# Patient Record
Sex: Female | Born: 1965 | Race: White | Hispanic: No | Marital: Married | Smoking: Current every day smoker
Health system: Southern US, Community
[De-identification: ages and names within clinical notes are randomized; demographics above are authoritative.]

## PROBLEM LIST (undated history)

## (undated) DIAGNOSIS — Z86010 Personal history of colonic polyps: Secondary | ICD-10-CM

## (undated) DIAGNOSIS — Z973 Presence of spectacles and contact lenses: Secondary | ICD-10-CM

## (undated) HISTORY — PX: MENISCUS REPAIR: SHX5179

## (undated) HISTORY — DX: Personal history of colonic polyps: Z86.010

## (undated) HISTORY — PX: COLONOSCOPY: SHX174

## (undated) HISTORY — PX: LEEP: SHX91

## (undated) HISTORY — PX: DILATION AND CURETTAGE OF UTERUS: SHX78

## (undated) HISTORY — PX: POLYPECTOMY: SHX149

---

## 2000-01-02 HISTORY — PX: ROTATOR CUFF REPAIR: SHX139

## 2004-05-31 ENCOUNTER — Ambulatory Visit (HOSPITAL_COMMUNITY): Admission: RE | Admit: 2004-05-31 | Discharge: 2004-05-31 | Payer: Self-pay | Admitting: Orthopedic Surgery

## 2004-05-31 ENCOUNTER — Ambulatory Visit (HOSPITAL_BASED_OUTPATIENT_CLINIC_OR_DEPARTMENT_OTHER): Admission: RE | Admit: 2004-05-31 | Discharge: 2004-05-31 | Payer: Self-pay | Admitting: Orthopedic Surgery

## 2009-01-01 HISTORY — PX: ANKLE SURGERY: SHX546

## 2012-01-02 DIAGNOSIS — Z86010 Personal history of colonic polyps: Secondary | ICD-10-CM

## 2012-01-02 HISTORY — DX: Personal history of colonic polyps: Z86.010

## 2012-08-05 ENCOUNTER — Encounter: Payer: Self-pay | Admitting: Gastroenterology

## 2012-08-29 ENCOUNTER — Ambulatory Visit (INDEPENDENT_AMBULATORY_CARE_PROVIDER_SITE_OTHER): Payer: 59 | Admitting: Gastroenterology

## 2012-08-29 ENCOUNTER — Encounter: Payer: Self-pay | Admitting: Gastroenterology

## 2012-08-29 ENCOUNTER — Other Ambulatory Visit (INDEPENDENT_AMBULATORY_CARE_PROVIDER_SITE_OTHER): Payer: 59

## 2012-08-29 ENCOUNTER — Other Ambulatory Visit: Payer: 59

## 2012-08-29 VITALS — BP 110/80 | HR 72 | Ht 61.75 in | Wt 206.2 lb

## 2012-08-29 DIAGNOSIS — R1032 Left lower quadrant pain: Secondary | ICD-10-CM

## 2012-08-29 DIAGNOSIS — R197 Diarrhea, unspecified: Secondary | ICD-10-CM

## 2012-08-29 MED ORDER — NA SULFATE-K SULFATE-MG SULF 17.5-3.13-1.6 GM/177ML PO SOLN: ORAL | Status: DC

## 2012-08-29 NOTE — Patient Instructions (Signed)
  You have been scheduled for a colonoscopy with propofol. Please follow written instructions given to you at your visit today.  Please pick up your prep kit at the pharmacy within the next 1-3 days. If you use inhalers (even only as needed), please bring them with you on the day of your procedure. Your physician has requested that you go to www.[email or web address] and enter the access code given to you at your visit today. This web site gives a general overview about your procedure. However, you should still follow specific instructions given to you by our office regarding your preparation for the procedure.  Please purchase Align over the counter and take one capsule by mouth once daily. Align puts good bacteria in your colon.  FOD MAP Diet given for your review.  Your physician has requested that you go to the basement for lab work before leaving today. ___________________________________________________________________________________________________                                               We are excited to introduce MyChart, a new best-in-class service that provides you online access to important information in your electronic medical record. We want to make it easier for you to view your health information - all in one secure location - when and where you need it. We expect MyChart will enhance the quality of care and service we provide.  When you register for MyChart, you can:    View your test results.    Request appointments and receive appointment reminders via email.    Request medication renewals.    View your medical history, allergies, medications and immunizations.    Communicate with your physician's office through a password-protected site.    Conveniently print information such as your medication lists.  To find out if MyChart is right for you, please talk to a member of our clinical staff today. We will gladly answer your questions about this free health and wellness  tool.  If you are age 88 or older and want a member of your family to have access to your record, you must provide written consent by completing a proxy form available at our office. Please speak to our clinical staff about guidelines regarding accounts for patients younger than age 45.  As you activate your MyChart account and need any technical assistance, please call the MyChart technical support line at [phone]) or email your question to [email or web address]. If you email your question(s), please include your name, a return phone number and the best time to reach you.  If you have non-urgent health-related questions, you can send a message to our office through MyChart at [email or web address]. If you have a medical emergency, call 911.  Thank you for using MyChart as your new health and wellness resource!   MyChart licensed from [employer],  [id number]. Patents Pending.

## 2012-08-29 NOTE — Progress Notes (Signed)
History of Present Illness:  This is a very pleasant 47 year old Caucasian female who has had 6 weeks of diarrhea described as loose nonbloody stools with some left lower quadrant cramping, episodes of incontinence, and some nocturnal diarrhea.  She says her diarrhea persists even if she does not eat, but she also has had partially digested food products in her stools.  Apparently stool O&P and routine culture and sensitivity is been negative.  She was treated empirically with Cipro 500 mg twice a day for 5 days several weeks ago without improvement.  She has tried lactobacillus supplements and Imodium with little response.  She denies anorexia, weight loss, or any systemic, upper GI or hepatobiliary complaints.  Apparently stool is now patient is been Hemoccult negative.  She's not had any previous gastrointestinal problems except for occasional one to two-day diarrhea episodes which abated normally.  There is been no infectious disease exposure, foreign travel, sick family members at home, family history of gastrointestinal problems.  She vehemently denies exposure antibiotics in the last year.  I have reviewed this patient's present history, medical and surgical past history, allergies and medications.     ROS:   All systems were reviewed and are negative unless otherwise stated in the HPI.  Allergies  Allergen Reactions  . Codeine Hives  . Sulfur Hives   No outpatient prescriptions prior to visit.   No facility-administered medications prior to visit.   History reviewed. No pertinent past medical history. Past Surgical History  Procedure Laterality Date  . Rotator cuff repair    . Ankle surgery    . Leep     History   Social History  . Marital Status: Married    Spouse Name: N/A    Number of Children: 2  . Years of Education: N/A   Occupational History  . Production Tech    Social History Main Topics  . Smoking status: Current Every Day Smoker  . Smokeless tobacco: Never Used   . Alcohol Use: No  . Drug Use: No  . Sexual Activity: None   Other Topics Concern  . None   Social History Narrative  . None   Family History  Problem Relation Age of Onset  . Heart disease Father        Physical Exam: Blood pressure 110/80, pulse 72 and regular and weight 206 with a BMI of 38.04. General well developed well nourished patient in no acute distress, appearing their stated age Eyes PERRLA, no icterus, fundoscopic exam per opthamologist Skin no lesions noted Neck supple, no adenopathy, no thyroid enlargement, no tenderness Chest clear to percussion and auscultation Heart no significant murmurs, gallops or rubs noted Abdomen no hepatosplenomegaly masses or tenderness, BS normal.  Rectal inspection normal no fissures, or fistulae noted.  No masses or tenderness on digital exam. Stool guaiac negative.  Stool is soft and fairly formed, has no particular odor, and is guaiac negative. Extremities no acute joint lesions, edema, phlebitis or evidence of cellulitis. Neurologic patient oriented x 3, cranial nerves intact, no focal neurologic deficits noted. Psychological mental status normal and normal affect.  Assessment and plan: Rule out inflammatory bowel disease versus IBS diarrhea resulting from an initial viral gastroenteritis.  I will repeat her stool O&P exam, C. difficile toxin assay,[place] fat staining, Giardia antigen, also do celiac panel, serum carotene, sedimentation rate and CRP.  She's been scheduled for colonoscopy at her convenience.  In the interim, she is to use a FOD-MAP diet for IBS diarrhea with when  necessary Imodium, and I placed her on Align daily probiotic therapy.  She may be a good candidate for Lotronex therapy if her workup is otherwise negative.  There is no history of cigarette, alcohol, or NSAID abuse.  She also has normal menstrual periods, and denies gynecologic problems.  She's not had previous endoscopic or colonoscopy exams.  She denies any  stressful situations in her life with history of neuropsychiatric problems.  The patient tried various lamination diets without any success.  There is no past history of hepatitis, pancreatitis, skin rashes, joint pains, or the reasons to suspect malabsorption.

## 2012-09-02 LAB — CELIAC PANEL 10
Endomysial Screen: NEGATIVE
Gliadin IgA: 1.8 U/mL (ref <20)
Gliadin IgG: 3.2 U/mL (ref <20)
IgA: 63 mg/dL — ABNORMAL LOW (ref 69–380)
Tissue Transglutaminase Ab, IgA: 9.1 U/mL (ref <20)

## 2012-09-12 ENCOUNTER — Ambulatory Visit (AMBULATORY_SURGERY_CENTER): Payer: 59 | Admitting: Gastroenterology

## 2012-09-12 ENCOUNTER — Encounter: Payer: Self-pay | Admitting: Gastroenterology

## 2012-09-12 VITALS — BP 140/77 | HR 65 | Temp 96.9°F | Resp 24 | Ht 61.0 in | Wt 206.0 lb

## 2012-09-12 DIAGNOSIS — Z1211 Encounter for screening for malignant neoplasm of colon: Secondary | ICD-10-CM

## 2012-09-12 DIAGNOSIS — D126 Benign neoplasm of colon, unspecified: Secondary | ICD-10-CM

## 2012-09-12 DIAGNOSIS — R197 Diarrhea, unspecified: Secondary | ICD-10-CM

## 2012-09-12 MED ORDER — SODIUM CHLORIDE 0.9 % IV SOLN: 500.0000 mL | INTRAVENOUS | Status: DC

## 2012-09-12 NOTE — Patient Instructions (Addendum)

## 2012-09-12 NOTE — Progress Notes (Signed)
Called to room to assist during endoscopic procedure.  Patient ID and intended procedure confirmed with present staff. Received instructions for my participation in the procedure from the performing physician.  

## 2012-09-12 NOTE — Progress Notes (Signed)
Patient did not have preoperative order for IV antibiotic SSI prophylaxis. (G8918)  Patient did not experience any of the following events: a burn prior to discharge; a fall within the facility; wrong site/side/patient/procedure/implant event; or a hospital transfer or hospital admission upon discharge from the facility. (G8907)  

## 2012-09-12 NOTE — Op Note (Signed)
[facility] [address] [place], [address]   COLONOSCOPY PROCEDURE REPORT  PATIENT: Angela Wood, Angela Wood  MR#: [id number] BIRTHDATE: 10/09/1965 , 47  yrs. old GENDER: Female ENDOSCOPIST: Mardella Layman, MD, [facility] REFERRED BY: PROCEDURE DATE:  09/12/2012 PROCEDURE:   Colonoscopy with biopsy and snare polypectomy First Screening Colonoscopy - Avg.  risk and is 50 yrs.  old or older Yes.  Prior Negative Screening - Now for repeat screening. N/A      Polyps Removed Today? Yes. ASA CLASS:   Class II INDICATIONS:average risk screening, chronic diarrhea, and unexplained diarrhea. MEDICATIONS: propofol (Diprivan) 200mg  IV  DESCRIPTION OF PROCEDURE:   After the risks benefits and alternatives of the procedure were thoroughly explained, informed consent was obtained.  A digital rectal exam revealed no abnormalities of the rectum.   The LB [id number]  endoscope was introduced through the anus and advanced to the cecum, which was identified by both the appendix and ileocecal valve. No adverse events experienced.   The quality of the prep was excellent, using MoviPrep  The instrument was then slowly withdrawn as the colon was fully examined.      COLON FINDINGS: A normal appearing cecum, ileocecal valve, and appendiceal orifice were identified.  The ascending, hepatic flexure, transverse, splenic flexure, descending, sigmoid colon and rectum appeared unremarkable.  No polyps or cancers were seen.Biopsies every 10 cm. done.   A polypoid shaped sessile polyp ranging between 3-25mm in size was found in the sigmoid colon.  A polypectomy was performed with a cold snare.  The resection was complete and the polyp tissue was completely retrieved. Retroflexed views revealed no abnormalities. The time to cecum=2 minutes 20 seconds.  Withdrawal time=7 minutes 14 seconds.  The scope was withdrawn and the procedure completed. COMPLICATIONS: There were no  complications.  ENDOSCOPIC IMPRESSION: 1.   Normal colon...r/o microscopic/collagenous colitis 2.   Sessile polyp ranging between 3-6mm in size was found in the sigmoid colon; polypectomy was performed with a cold snare  RECOMMENDATIONS: 1.  Await biopsy results 2.  Repeat colonoscopy in 5 years if polyp adenomatous; otherwise 10 years 3. Start Lotronex .5 md/day if biopsies are normal.....   eSigned:  Mardella Layman, MD, [facility] 09/12/2012 1:42 PM   cc:

## 2012-09-12 NOTE — Progress Notes (Signed)
1342 A/ox3 pleased with MAC, report to [employer]

## 2012-09-15 ENCOUNTER — Telehealth: Payer: Self-pay

## 2012-09-15 NOTE — Telephone Encounter (Signed)
Left a message on the pt's answering machine at # [phone] for the pt to call if any questions or concerns. Maw

## 2012-10-01 ENCOUNTER — Encounter: Payer: Self-pay | Admitting: Gastroenterology

## 2012-11-06 ENCOUNTER — Other Ambulatory Visit: Payer: Self-pay

## 2013-09-05 ENCOUNTER — Emergency Department (HOSPITAL_COMMUNITY): Payer: 59

## 2013-09-05 ENCOUNTER — Encounter (HOSPITAL_COMMUNITY): Payer: Self-pay | Admitting: Emergency Medicine

## 2013-09-05 ENCOUNTER — Emergency Department (HOSPITAL_COMMUNITY)
Admission: EM | Admit: 2013-09-05 | Discharge: 2013-09-05 | Disposition: A | Discharge status: Home / Self Care | Payer: 59 | Attending: Emergency Medicine | Admitting: Emergency Medicine

## 2013-09-05 DIAGNOSIS — S82843A Displaced bimalleolar fracture of unspecified lower leg, initial encounter for closed fracture: Secondary | ICD-10-CM | POA: Insufficient documentation

## 2013-09-05 DIAGNOSIS — F17200 Nicotine dependence, unspecified, uncomplicated: Secondary | ICD-10-CM | POA: Insufficient documentation

## 2013-09-05 DIAGNOSIS — Y9289 Other specified places as the place of occurrence of the external cause: Secondary | ICD-10-CM | POA: Diagnosis not present

## 2013-09-05 DIAGNOSIS — Y9389 Activity, other specified: Secondary | ICD-10-CM | POA: Diagnosis not present

## 2013-09-05 DIAGNOSIS — Z79899 Other long term (current) drug therapy: Secondary | ICD-10-CM | POA: Diagnosis not present

## 2013-09-05 DIAGNOSIS — S82402A Unspecified fracture of shaft of left fibula, initial encounter for closed fracture: Secondary | ICD-10-CM

## 2013-09-05 DIAGNOSIS — S8261XA Displaced fracture of lateral malleolus of right fibula, initial encounter for closed fracture: Secondary | ICD-10-CM

## 2013-09-05 DIAGNOSIS — S99929A Unspecified injury of unspecified foot, initial encounter: Secondary | ICD-10-CM

## 2013-09-05 DIAGNOSIS — S8990XA Unspecified injury of unspecified lower leg, initial encounter: Secondary | ICD-10-CM | POA: Insufficient documentation

## 2013-09-05 DIAGNOSIS — S99919A Unspecified injury of unspecified ankle, initial encounter: Secondary | ICD-10-CM

## 2013-09-05 DIAGNOSIS — Z9889 Other specified postprocedural states: Secondary | ICD-10-CM | POA: Insufficient documentation

## 2013-09-05 DIAGNOSIS — IMO0002 Reserved for concepts with insufficient information to code with codable children: Secondary | ICD-10-CM | POA: Diagnosis not present

## 2013-09-05 DIAGNOSIS — W108XXA Fall (on) (from) other stairs and steps, initial encounter: Secondary | ICD-10-CM | POA: Diagnosis not present

## 2013-09-05 MED ORDER — NAPROXEN 500 MG PO TABS: 500.0000 mg | ORAL_TABLET | Freq: Two times a day (BID) | ORAL | Status: DC | PRN

## 2013-09-05 MED ORDER — ACETAMINOPHEN 325 MG PO TABS
650.0000 mg | ORAL_TABLET | Freq: Once | ORAL | Status: AC
  Administered 2013-09-05: 650 mg via ORAL
  Filled 2013-09-05: qty 2

## 2013-09-05 MED ORDER — ACETAMINOPHEN-CODEINE #3 300-30 MG PO TABS: 1.0000 | ORAL_TABLET | Freq: Four times a day (QID) | ORAL | Status: DC | PRN

## 2013-09-05 MED ORDER — HYDROCODONE-ACETAMINOPHEN 5-325 MG PO TABS: 1.0000 | ORAL_TABLET | Freq: Four times a day (QID) | ORAL | Status: DC | PRN

## 2013-09-05 NOTE — Discharge Instructions (Signed)
Wear ankle splint until you see Dr. Percell Miller. Use crutches for all weight bearing, do not use your LEFT ankle for weight bearing, but you can use your right ankle. Use a strap up brace on the right ankle for support. Ice and elevate ankle throughout the day. Alternate between naprosyn and norco for pain relief. Do not drive or operate machinery with pain medication use. Call Dr. Percell Miller on Tuesday for ongoing management of your ankle fracture. Return to the ER for changes or worsening symptoms.   Ankle Fracture A fracture is a break in a bone. A cast or splint may be used to protect the ankle and heal the break. Sometimes, surgery is needed. HOME CARE  Use crutches as told by your doctor. It is very important that you use your crutches correctly.  Do not put weight or pressure on the injured ankle until told by your doctor.  Keep your ankle raised (elevated) when sitting or lying down.  Apply ice to the ankle:  Put ice in a plastic bag.  Place a towel between your cast and the bag.  Leave the ice on for 20 minutes, 2-3 times a day.  If you have a plaster or fiberglass cast:  Do not try to scratch under the cast with any objects.  Check the skin around the cast every day. You may put lotion on red or sore areas.  Keep your cast dry and clean.  If you have a plaster splint:  Wear the splint as told by your doctor.  You can loosen the elastic around the splint if your toes get numb, tingle, or turn cold or blue.  Do not put pressure on any part of your cast or splint. It may break. Rest your plaster splint or cast only on a pillow the first 24 hours until it is fully hardened.  Cover your cast or splint with a plastic bag during showers.  Do not lower your cast or splint into water.  Take medicine as told by your doctor.  Do not drive until your doctor says it is safe.  Follow-up with your doctor as told. It is very important that you go to your follow-up visits. GET HELP  IF: The swelling and discomfort gets worse.  GET HELP RIGHT AWAY IF:   Your splint or cast breaks.  You continue to have very bad pain.  You have new pain or swelling after your splint or cast was put on.  Your skin or toes below the injured ankle:  Turn blue or gray.  Feel cold, numb, or you cannot feel them.  There is a bad smell or yellowish white fluid (pus) coming from under the splint or cast. MAKE SURE YOU:   Understand these instructions.  Will watch your condition.  Will get help right away if you are not doing well or get worse. Document Released: 10/15/2008 Document Revised: 10/08/2012 Document Reviewed: 07/17/2012 [facility] Patient Information 2015 [place], [place]. This information is not intended to replace advice given to you by your health care provider. Make sure you discuss any questions you have with your health care provider.  Cast or Splint Care Casts and splints support injured limbs and keep bones from moving while they heal. It is important to care for your cast or splint at home.  HOME CARE INSTRUCTIONS  Keep the cast or splint uncovered during the drying period. It can take 24 to 48 hours to dry if it is made of plaster. A fiberglass cast will  dry in less than 1 hour.  Do not rest the cast on anything harder than a pillow for the first 24 hours.  Do not put weight on your injured limb or apply pressure to the cast until your health care provider gives you permission.  Keep the cast or splint dry. Wet casts or splints can lose their shape and may not support the limb as well. A wet cast that has lost its shape can also create harmful pressure on your skin when it dries. Also, wet skin can become infected.  Cover the cast or splint with a plastic bag when bathing or when out in the rain or snow. If the cast is on the trunk of the body, take sponge baths until the cast is removed.  If your cast does become wet, dry it with a towel or a blow dryer on the  cool setting only.  Keep your cast or splint clean. Soiled casts may be wiped with a moistened cloth.  Do not place any hard or soft foreign objects under your cast or splint, such as cotton, toilet paper, lotion, or powder.  Do not try to scratch the skin under the cast with any object. The object could get stuck inside the cast. Also, scratching could lead to an infection. If itching is a problem, use a blow dryer on a cool setting to relieve discomfort.  Do not trim or cut your cast or remove padding from inside of it.  Exercise all joints next to the injury that are not immobilized by the cast or splint. For example, if you have a long leg cast, exercise the hip joint and toes. If you have an arm cast or splint, exercise the shoulder, elbow, thumb, and fingers.  Elevate your injured arm or leg on 1 or 2 pillows for the first 1 to 3 days to decrease swelling and pain.It is best if you can comfortably elevate your cast so it is higher than your heart. SEEK MEDICAL CARE IF:   Your cast or splint cracks.  Your cast or splint is too tight or too loose.  You have unbearable itching inside the cast.  Your cast becomes wet or develops a soft spot or area.  You have a bad smell coming from inside your cast.  You get an object stuck under your cast.  Your skin around the cast becomes red or raw.  You have new pain or worsening pain after the cast has been applied. SEEK IMMEDIATE MEDICAL CARE IF:   You have fluid leaking through the cast.  You are unable to move your fingers or toes.  You have discolored (blue or white), cool, painful, or very swollen fingers or toes beyond the cast.  You have tingling or numbness around the injured area.  You have severe pain or pressure under the cast.  You have any difficulty with your breathing or have shortness of breath.  You have chest pain. Document Released: 12/16/1999 Document Revised: 10/08/2012 Document Reviewed:  06/26/2012 [facility] Patient Information 2015 [place], [place]. This information is not intended to replace advice given to you by your health care provider. Make sure you discuss any questions you have with your health care provider.

## 2013-09-05 NOTE — ED Notes (Signed)
The pt fell down steps after she missed 2 steps.  She has pain in her lt ankle.  lmp 2 days ago

## 2013-09-05 NOTE — Progress Notes (Addendum)
Orthopedic Tech Progress Note Patient Details:  Angela Wood August 18, 1965 [id number] Applied fiberglass posterior short leg and fiberglass stirrup splint to LLE.  Pulses, sensation, movement intact before and after splinting.  Capillary refill less than 2 seconds before and after splinting.  Fit crutches and taught pt. use of same. Ortho Devices Type of Ortho Device: Post (short leg) splint Ortho Device/Splint Location: LLE Ortho Device/Splint Interventions: Application   Darrol Poke 09/05/2013, 9:34 AM

## 2013-09-05 NOTE — ED Notes (Signed)
Pt refusing to have pain medication. Nurse walked in room and pt with help of husband walked to bathroom in the room. Pt instructed not to walk anymore d/t injury to ankle.

## 2013-09-05 NOTE — ED Provider Notes (Signed)
CSN: [id number]     Arrival date & time 09/05/13  9811 History   First MD Initiated Contact with Patient 09/05/13 [phone]     Chief Complaint  Patient presents with  . Ankle Injury     (Consider location/radiation/quality/duration/timing/severity/associated sxs/prior Treatment) HPI Comments: Angela Wood is a 48 y.o. female with a PSHx of tendon repair to R ankle, who presents to the ED with complaints of bilateral ankle pain after falling down the stairs, states that she missed 2 steps which resulted in the fall. Reports her left ankle is tender over the lateral aspect, 7/10 sharp pain, constant, nonradiating, worse with ambulation, and with no known alleviating factors given that she did not take anything prior to arrival. She also reports some right-sided ankle pain over the area of her previous tendon repair on the medial aspect, but that this is a dull minor pain and she is able to ambulate on this foot. Denies any head injury or loss of consciousness. Received some scrapes to her knees and hands, but no injuries to these areas. Denies any numbness or tingling, loss of range of motion, muscle weakness, back pain, neck pain, stiffness, or swelling. Denies any other injuries.  Patient is a 48 y.o. female presenting with ankle pain. The history is provided by the patient. No language interpreter was used.  Ankle Pain Location:  Ankle Time since incident:  1 hour Injury: yes   Mechanism of injury: fall   Fall:    Fall occurred:  Down stairs   Height of fall:  2 steps   Impact surface:  [employer] of impact:  Feet and outstretched arms   Entrapped after fall: no   Ankle location:  L ankle and R ankle Pain details:    Quality:  Sharp   Radiates to:  Does not radiate   Severity:  Moderate (7/10)   Onset quality:  Sudden   Duration:  1 hour   Timing:  Constant   Progression:  Unchanged Chronicity:  New Prior injury to area: Right ankle- injured tendons previously, had  surgery. Relieved by:  None tried Worsened by:  Bearing weight Ineffective treatments:  None tried Associated symptoms: no back pain, no decreased ROM, no muscle weakness, no neck pain, no numbness, no stiffness, no swelling and no tingling     History reviewed. No pertinent past medical history. Past Surgical History  Procedure Laterality Date  . Rotator cuff repair    . Ankle surgery    . Leep    . Ankle surgery     Family History  Problem Relation Age of Onset  . Heart disease Father    History  Substance Use Topics  . Smoking status: Current Every Day Smoker  . Smokeless tobacco: Never Used  . Alcohol Use: No   OB History   Grav Para Term Preterm Abortions TAB SAB Ect Mult Living                 Review of Systems  Musculoskeletal: Positive for arthralgias (ankle pain bilaterally) and gait problem (difficult to bear weight in L ankle). Negative for back pain, joint swelling, neck pain and stiffness.  Skin: Positive for wound (abrasions to knee). Negative for color change.  Neurological: Negative for syncope, weakness, numbness and headaches.  Hematological: Does not bruise/bleed easily.  Psychiatric/Behavioral: Negative for confusion.  10 Systems reviewed and are negative for acute change except as noted in the HPI.     Allergies  Codeine and  Sulfur  Home Medications   Prior to Admission medications   Medication Sig Start Date End Date Taking? Authorizing Provider  Melatonin 5 MG TABS Take 5 mg by mouth at bedtime.   Yes Historical Provider, MD  HYDROcodone-acetaminophen (NORCO) 5-325 MG per tablet Take 1-2 tablets by mouth every 6 (six) hours as needed for severe pain. 09/05/13   Afifa Truax Strupp Camprubi-Soms, PA-C  naproxen (NAPROSYN) 500 MG tablet Take 1 tablet (500 mg total) by mouth 2 (two) times daily as needed for mild pain, moderate pain or headache (TAKE WITH MEALS.). 09/05/13   Daphney Hopke Strupp Camprubi-Soms, PA-C   BP 140/74  Pulse 81  Temp(Src) 98.6 F  (37 C) (Oral)  Resp 18  Ht 5\' 6"  (1.676 m)  Wt 208 lb (94.348 kg)  BMI 33.59 kg/m2  SpO2 98%  LMP 09/02/2013 Physical Exam  Nursing note and vitals reviewed. Constitutional: She is oriented to person, place, and time. Vital signs are normal. She appears well-developed and well-nourished. No distress.  VSS, NAD, pleasant and cooperative  HENT:  Head: Normocephalic and atraumatic.  Mouth/Throat: Mucous membranes are normal.  Eyes: Conjunctivae and EOM are normal. Right eye exhibits no discharge. Left eye exhibits no discharge.  Neck: Normal range of motion. Neck supple. No spinous process tenderness and no muscular tenderness present. No rigidity. Normal range of motion present.  FROM intact without spinous process or paraspinous muscle TTP, no bony stepoffs or deformities, no muscle spasms. No rigidity or meningeal signs. No bruising or swelling.  Cardiovascular: Normal rate and intact distal pulses.   Distal pulses intact  Pulmonary/Chest: Effort normal. No respiratory distress.  Abdominal: Normal appearance. She exhibits no distension.  Musculoskeletal: Normal range of motion.       Right ankle: She exhibits swelling. She exhibits normal range of motion, no ecchymosis, no deformity and normal pulse. Tenderness. Lateral malleolus tenderness found. Achilles tendon normal.       Left ankle: She exhibits swelling and ecchymosis. She exhibits normal range of motion, no deformity and normal pulse. Tenderness. Lateral malleolus tenderness found. Achilles tendon normal.  L ankle with FROM intact, mild swelling and bruising noted to lateral malleolus, approx 3cm proximal from the end, with TTP over this area. No other TTP in surrounding soft tissues. Achilles tendon WNL. R ankle with FROM intact, small amount of trace swelling to distal lateral malleolus with no bruising, mild TTP to this area, no other TTP in remainder of foot/ankle. Achilles tendon WNL.  Strength 5/5 in all extremities, antalgic  gait but able to ambulate with both ankles, sensation grossly intact, distal pulses and cap refill intact throughout.  Neurological: She is alert and oriented to person, place, and time. She has normal strength. No sensory deficit. Gait (antalgic) abnormal.  Skin: Skin is warm, dry and intact. Bruising (L ankle) noted. No rash noted.  Psychiatric: She has a normal mood and affect.    ED Course  Procedures (including critical care time) Labs Review Labs Reviewed - No data to display  Imaging Review Dg Ankle Complete Left  09/05/2013   CLINICAL DATA:  Ankle pain and swelling.  Fall.  EXAM: LEFT ANKLE COMPLETE - 3+ VIEW  COMPARISON:  None.  FINDINGS: There is an oblique fracture in the distal fibula extending into the ankle joint. The fracture begins at the tibial plafond and then extends superiorly and posteriorly. Medial malleolus and posterior malleolus are intact. Talus and calcaneus are intact. Spur at the inferior calcaneus.  IMPRESSION: Lateral malleolus fracture.  Weber B injury.   Electronically Signed   By: Maryclare Bean M.D.   On: 09/05/2013 07:24   Dg Ankle Complete Right  09/05/2013   CLINICAL DATA:  Right ankle pain.  Fall.  EXAM: RIGHT ANKLE - COMPLETE 3+ VIEW  COMPARISON:  None.  FINDINGS: There is a small bony density dorsal to the talar neck which has a chronic appearance. Spurring at the inferior calcaneus. Small bony densities adjacent to the tip of the fibula are present. Avulsion fractures of indeterminate age are not excluded. There is associated soft tissue swelling about the lateral malleolus. Tibia is intact. Spurring at the inferior calcaneus.  IMPRESSION: Small avulsion fracture from the inferior tip of the fibula is not excluded.   Electronically Signed   By: Maryclare Bean M.D.   On: 09/05/2013 07:25     EKG Interpretation None      MDM   Final diagnoses:  Left fibular fracture, closed, initial encounter  Avulsion fracture of lateral malleolus, right, closed, initial  encounter    48y/o female with fall down 3 steps, and subsequent ankle pain, L>R. Pt declined pain meds after multiple offers, states she doesn't like taking meds. Xrays show L fibular fx, and R lateral malleolar avulsion fx. Discussed with Dr. French Ana (Dr. Debroah Loop associate) who stated to place pt in splint for L fx with nonweight bearing, but allow pt to bear weight on R. Given that pt is able to ambulate with R leg, will place splint and have her f/up with Dr. Percell Miller on Tuesday. Rx for pain control given. Discussed ice/elevation. I explained the diagnosis and have given explicit precautions to return to the ER including for any other new or worsening symptoms. The patient understands and accepts the medical plan as it's been dictated and I have answered their questions. Discharge instructions concerning home care and prescriptions have been given. The patient is STABLE and is discharged to home in good condition.  BP 148/58  Pulse 74  Temp(Src) 98.6 F (37 C) (Oral)  Resp 18  Ht 5\' 6"  (1.676 m)  Wt 208 lb (94.348 kg)  BMI 33.59 kg/m2  SpO2 100%  LMP 08/25/2013  Meds ordered this encounter  Medications  . naproxen (NAPROSYN) 500 MG tablet    Sig: Take 1 tablet (500 mg total) by mouth 2 (two) times daily as needed for mild pain, moderate pain or headache (TAKE WITH MEALS.).    Dispense:  20 tablet    Refill:  0    Order Specific Question:  Supervising Provider    Answer:  Noemi Chapel D [[id number]]  . HYDROcodone-acetaminophen (NORCO) 5-325 MG per tablet    Sig: Take 1-2 tablets by mouth every 6 (six) hours as needed for severe pain.    Dispense:  10 tablet    Refill:  0    Order Specific Question:  Supervising Provider    Answer:  Noemi Chapel D [[id number]]  . DISCONTD: acetaminophen-codeine (TYLENOL #3) 300-30 MG per tablet    Sig: Take 1 tablet by mouth every 6 (six) hours as needed for moderate pain.    Dispense:  20 tablet    Refill:  0    Order Specific Question:  Supervising  Provider    Answer:  Noemi Chapel D [[id number]]       Shaquita Fort Strupp Camprubi-Soms, PA-C 09/05/13 0900

## 2013-09-06 NOTE — ED Provider Notes (Signed)
Medical screening examination/treatment/procedure(s) were performed by non-physician practitioner and as supervising physician I was immediately available for consultation/collaboration.   EKG Interpretation None        Julianne Rice, MD 09/06/13 0700

## 2013-09-09 ENCOUNTER — Encounter (HOSPITAL_BASED_OUTPATIENT_CLINIC_OR_DEPARTMENT_OTHER): Payer: Self-pay | Admitting: *Deleted

## 2013-09-09 NOTE — Progress Notes (Signed)
No labs needed

## 2013-09-10 ENCOUNTER — Encounter (HOSPITAL_BASED_OUTPATIENT_CLINIC_OR_DEPARTMENT_OTHER): Payer: 59 | Admitting: Anesthesiology

## 2013-09-10 ENCOUNTER — Encounter (HOSPITAL_BASED_OUTPATIENT_CLINIC_OR_DEPARTMENT_OTHER): Payer: Self-pay | Admitting: *Deleted

## 2013-09-10 ENCOUNTER — Ambulatory Visit (HOSPITAL_BASED_OUTPATIENT_CLINIC_OR_DEPARTMENT_OTHER): Payer: 59 | Admitting: Anesthesiology

## 2013-09-10 ENCOUNTER — Ambulatory Visit (HOSPITAL_BASED_OUTPATIENT_CLINIC_OR_DEPARTMENT_OTHER)
Admission: RE | Admit: 2013-09-10 | Discharge: 2013-09-10 | Disposition: A | Discharge status: Home / Self Care | Payer: 59 | Source: Ambulatory Visit | Attending: Orthopedic Surgery | Admitting: Orthopedic Surgery

## 2013-09-10 ENCOUNTER — Encounter (HOSPITAL_BASED_OUTPATIENT_CLINIC_OR_DEPARTMENT_OTHER)
Admission: RE | Disposition: A | Discharge status: Home / Self Care | Payer: Self-pay | Source: Ambulatory Visit | Attending: Orthopedic Surgery

## 2013-09-10 DIAGNOSIS — W19XXXA Unspecified fall, initial encounter: Secondary | ICD-10-CM | POA: Diagnosis not present

## 2013-09-10 DIAGNOSIS — F17200 Nicotine dependence, unspecified, uncomplicated: Secondary | ICD-10-CM | POA: Insufficient documentation

## 2013-09-10 DIAGNOSIS — S82899A Other fracture of unspecified lower leg, initial encounter for closed fracture: Secondary | ICD-10-CM | POA: Insufficient documentation

## 2013-09-10 HISTORY — PX: ORIF ANKLE FRACTURE: SHX5408

## 2013-09-10 HISTORY — DX: Presence of spectacles and contact lenses: Z97.3

## 2013-09-10 LAB — POCT HEMOGLOBIN-HEMACUE: HEMOGLOBIN: 14.7 g/dL (ref 12.0–15.0)

## 2013-09-10 SURGERY — OPEN REDUCTION INTERNAL FIXATION (ORIF) ANKLE FRACTURE
Anesthesia: Regional | Site: Ankle | Laterality: Left

## 2013-09-10 MED ORDER — OXYCODONE HCL 5 MG/5ML PO SOLN: 5.0000 mg | Freq: Once | ORAL | Status: DC | PRN

## 2013-09-10 MED ORDER — MIDAZOLAM HCL 2 MG/2ML IJ SOLN: 1.0000 mg | INTRAMUSCULAR | Status: DC | PRN

## 2013-09-10 MED ORDER — LIDOCAINE HCL (CARDIAC) 20 MG/ML IV SOLN
INTRAVENOUS | Status: DC | PRN
  Administered 2013-09-10: 50 mg via INTRAVENOUS

## 2013-09-10 MED ORDER — LACTATED RINGERS IV SOLN
INTRAVENOUS | Status: DC
  Administered 2013-09-10 (×2): via INTRAVENOUS

## 2013-09-10 MED ORDER — LACTATED RINGERS IV SOLN: INTRAVENOUS | Status: DC

## 2013-09-10 MED ORDER — OXYCODONE HCL 5 MG PO TABS: 5.0000 mg | ORAL_TABLET | Freq: Once | ORAL | Status: DC | PRN

## 2013-09-10 MED ORDER — DEXAMETHASONE SODIUM PHOSPHATE 10 MG/ML IJ SOLN
INTRAMUSCULAR | Status: DC | PRN
  Administered 2013-09-10: 10 mg via INTRAVENOUS

## 2013-09-10 MED ORDER — PROPOFOL 10 MG/ML IV BOLUS
INTRAVENOUS | Status: DC | PRN
  Administered 2013-09-10: 200 mg via INTRAVENOUS

## 2013-09-10 MED ORDER — FENTANYL CITRATE 0.05 MG/ML IJ SOLN
INTRAMUSCULAR | Status: DC | PRN
  Administered 2013-09-10: 25 ug via INTRAVENOUS

## 2013-09-10 MED ORDER — FENTANYL CITRATE 0.05 MG/ML IJ SOLN
INTRAMUSCULAR | Status: AC
  Filled 2013-09-10: qty 4

## 2013-09-10 MED ORDER — ASPIRIN 81 MG PO TABS: 81.0000 mg | ORAL_TABLET | Freq: Every day | ORAL | Status: DC

## 2013-09-10 MED ORDER — ONDANSETRON HCL 4 MG/2ML IJ SOLN
INTRAMUSCULAR | Status: DC | PRN
  Administered 2013-09-10: 4 mg via INTRAVENOUS

## 2013-09-10 MED ORDER — FENTANYL CITRATE 0.05 MG/ML IJ SOLN
50.0000 ug | INTRAMUSCULAR | Status: DC | PRN
Start: 2013-09-10 — End: 2013-09-10
  Administered 2013-09-10: 100 ug via INTRAVENOUS

## 2013-09-10 MED ORDER — DOCUSATE SODIUM 100 MG PO CAPS: 100.0000 mg | ORAL_CAPSULE | Freq: Two times a day (BID) | ORAL | Status: DC

## 2013-09-10 MED ORDER — HYDROMORPHONE HCL PF 1 MG/ML IJ SOLN: 0.2500 mg | INTRAMUSCULAR | Status: DC | PRN

## 2013-09-10 MED ORDER — ONDANSETRON HCL 4 MG PO TABS: 4.0000 mg | ORAL_TABLET | Freq: Three times a day (TID) | ORAL | Status: DC | PRN

## 2013-09-10 MED ORDER — BUPIVACAINE-EPINEPHRINE (PF) 0.5% -1:200000 IJ SOLN
INTRAMUSCULAR | Status: DC | PRN
Start: 2013-09-10 — End: 2013-09-10
  Administered 2013-09-10: 35 mL

## 2013-09-10 MED ORDER — ACETAMINOPHEN 500 MG PO TABS
1000.0000 mg | ORAL_TABLET | Freq: Once | ORAL | Status: AC
  Administered 2013-09-10: 1000 mg via ORAL

## 2013-09-10 MED ORDER — CEFAZOLIN SODIUM-DEXTROSE 2-3 GM-% IV SOLR
INTRAVENOUS | Status: AC
  Filled 2013-09-10: qty 50

## 2013-09-10 MED ORDER — DEXTROSE-NACL 5-0.45 % IV SOLN: 100.0000 mL/h | INTRAVENOUS | Status: DC

## 2013-09-10 MED ORDER — OXYCODONE-ACETAMINOPHEN 5-325 MG PO TABS
2.0000 | ORAL_TABLET | ORAL | Status: DC | PRN
Start: 2013-09-10 — End: 2017-10-07

## 2013-09-10 MED ORDER — ACETAMINOPHEN 500 MG PO TABS
ORAL_TABLET | ORAL | Status: AC
  Filled 2013-09-10: qty 2

## 2013-09-10 MED ORDER — MIDAZOLAM HCL 2 MG/2ML IJ SOLN
INTRAMUSCULAR | Status: AC
  Filled 2013-09-10: qty 2

## 2013-09-10 MED ORDER — CEFAZOLIN SODIUM-DEXTROSE 2-3 GM-% IV SOLR
2.0000 g | INTRAVENOUS | Status: AC
Start: 2013-09-10 — End: 2013-09-10
  Administered 2013-09-10: 2 g via INTRAVENOUS

## 2013-09-10 MED ORDER — FENTANYL CITRATE 0.05 MG/ML IJ SOLN: 50.0000 ug | INTRAMUSCULAR | Status: DC | PRN

## 2013-09-10 MED ORDER — FENTANYL CITRATE 0.05 MG/ML IJ SOLN
INTRAMUSCULAR | Status: AC
  Filled 2013-09-10: qty 2

## 2013-09-10 MED ORDER — MIDAZOLAM HCL 2 MG/2ML IJ SOLN
1.0000 mg | INTRAMUSCULAR | Status: DC | PRN
  Administered 2013-09-10: 2 mg via INTRAVENOUS

## 2013-09-10 SURGICAL SUPPLY — 67 items
BANDAGE ELASTIC 4 VELCRO ST LF (GAUZE/BANDAGES/DRESSINGS) ×2 IMPLANT
BANDAGE ELASTIC 6 VELCRO ST LF (GAUZE/BANDAGES/DRESSINGS) ×2 IMPLANT
BANDAGE ESMARK 6X9 LF (GAUZE/BANDAGES/DRESSINGS) ×1 IMPLANT
BIT DRILL 2.5X125 (BIT) ×2 IMPLANT
BIT DRILL 3.5X125 (BIT) ×1 IMPLANT
BLADE SURG 15 STRL LF DISP TIS (BLADE) ×2 IMPLANT
BLADE SURG 15 STRL SS (BLADE) ×2
BNDG COHESIVE 4X5 TAN STRL (GAUZE/BANDAGES/DRESSINGS) ×2 IMPLANT
BNDG ESMARK 6X9 LF (GAUZE/BANDAGES/DRESSINGS) ×2
CHLORAPREP W/TINT 26ML (MISCELLANEOUS) ×2 IMPLANT
COVER MAYO STAND STRL (DRAPES) ×2 IMPLANT
COVER TABLE BACK 60X90 (DRAPES) ×2 IMPLANT
CUFF TOURNIQUET SINGLE 24IN (TOURNIQUET CUFF) IMPLANT
CUFF TOURNIQUET SINGLE 34IN LL (TOURNIQUET CUFF) IMPLANT
CUFF TOURNIQUET SINGLE 44IN (TOURNIQUET CUFF) ×2 IMPLANT
DECANTER SPIKE VIAL GLASS SM (MISCELLANEOUS) IMPLANT
DRAPE C-ARM 42X72 X-RAY (DRAPES) IMPLANT
DRAPE C-ARMOR (DRAPES) IMPLANT
DRAPE EXTREMITY T 121X128X90 (DRAPE) ×2 IMPLANT
DRAPE OEC MINIVIEW 54X84 (DRAPES) ×2 IMPLANT
DRAPE U 20/CS (DRAPES) ×2 IMPLANT
DRAPE U-SHAPE 47X51 STRL (DRAPES) ×4 IMPLANT
DRILL BIT 3.5X125 (BIT) ×1
DRSG EMULSION OIL 3X3 NADH (GAUZE/BANDAGES/DRESSINGS) ×2 IMPLANT
ELECT REM PT RETURN 9FT ADLT (ELECTROSURGICAL) ×2
ELECTRODE REM PT RTRN 9FT ADLT (ELECTROSURGICAL) ×1 IMPLANT
GAUZE SPONGE 4X4 12PLY STRL (GAUZE/BANDAGES/DRESSINGS) ×2 IMPLANT
GLOVE BIO SURGEON STRL SZ7.5 (GLOVE) ×2 IMPLANT
GLOVE BIO SURGEON STRL SZ8 (GLOVE) ×4 IMPLANT
GLOVE BIOGEL PI IND STRL 7.0 (GLOVE) ×1 IMPLANT
GLOVE BIOGEL PI IND STRL 8 (GLOVE) ×3 IMPLANT
GLOVE BIOGEL PI INDICATOR 7.0 (GLOVE) ×1
GLOVE BIOGEL PI INDICATOR 8 (GLOVE) ×3
GLOVE ECLIPSE 6.5 STRL STRAW (GLOVE) ×2 IMPLANT
GOWN STRL REUS W/ TWL LRG LVL3 (GOWN DISPOSABLE) ×4 IMPLANT
GOWN STRL REUS W/ TWL XL LVL3 (GOWN DISPOSABLE) ×2 IMPLANT
GOWN STRL REUS W/TWL LRG LVL3 (GOWN DISPOSABLE) ×4
GOWN STRL REUS W/TWL XL LVL3 (GOWN DISPOSABLE) ×2
NEEDLE HYPO 22GX1.5 SAFETY (NEEDLE) IMPLANT
NS IRRIG 1000ML POUR BTL (IV SOLUTION) ×2 IMPLANT
PACK BASIN DAY SURGERY FS (CUSTOM PROCEDURE TRAY) ×2 IMPLANT
PAD CAST 4YDX4 CTTN HI CHSV (CAST SUPPLIES) ×1 IMPLANT
PADDING CAST COTTON 4X4 STRL (CAST SUPPLIES) ×1
PADDING CAST COTTON 6X4 STRL (CAST SUPPLIES) ×2 IMPLANT
PENCIL BUTTON HOLSTER BLD 10FT (ELECTRODE) ×2 IMPLANT
PLATE TUBULAR 1/3 5H (Plate) ×2 IMPLANT
SCREW CANCELLOUS 4.0X14 (Screw) ×2 IMPLANT
SCREW CORTEX ST MATTA 3.5X14 (Screw) ×4 IMPLANT
SCREW CORTEX ST MATTA 3.5X18MM (Screw) ×2 IMPLANT
SCREW CORTEX ST MATTA 3.5X20 (Screw) ×2 IMPLANT
SLEEVE SCD COMPRESS KNEE MED (MISCELLANEOUS) ×2 IMPLANT
SPLINT FAST PLASTER 5X30 (CAST SUPPLIES) ×15
SPLINT PLASTER CAST FAST 5X30 (CAST SUPPLIES) ×15 IMPLANT
SPONGE LAP 4X18 X RAY DECT (DISPOSABLE) ×2 IMPLANT
STRIP CLOSURE SKIN 1/2X4 (GAUZE/BANDAGES/DRESSINGS) ×2 IMPLANT
SUCTION FRAZIER TIP 10 FR DISP (SUCTIONS) ×2 IMPLANT
SUT MON AB 2-0 CT1 36 (SUTURE) ×2 IMPLANT
SUT MON AB 4-0 PC3 18 (SUTURE) ×2 IMPLANT
SUT VIC AB 0 SH 27 (SUTURE) ×2 IMPLANT
SUT VIC AB 2-0 SH 27 (SUTURE)
SUT VIC AB 2-0 SH 27XBRD (SUTURE) IMPLANT
SYR 20CC LL (SYRINGE) IMPLANT
SYR BULB 3OZ (MISCELLANEOUS) ×2 IMPLANT
TOWEL OR 17X24 6PK STRL BLUE (TOWEL DISPOSABLE) ×4 IMPLANT
TOWEL OR NON WOVEN STRL DISP B (DISPOSABLE) ×2 IMPLANT
TUBE CONNECTING 20X1/4 (TUBING) ×4 IMPLANT
UNDERPAD 30X30 INCONTINENT (UNDERPADS AND DIAPERS) ×2 IMPLANT

## 2013-09-10 NOTE — Discharge Instructions (Signed)
Elevate as much as possible.  No weight on Left leg  Keep splint clean and dry  Post Anesthesia Home Care Instructions  Activity: Get plenty of rest for the remainder of the day. A responsible adult should stay with you for 24 hours following the procedure.  For the next 24 hours, DO NOT: -Drive a car -Paediatric nurse -Drink alcoholic beverages -Take any medication unless instructed by your physician -Make any legal decisions or sign important papers.  Meals: Start with liquid foods such as gelatin or soup. Progress to regular foods as tolerated. Avoid greasy, spicy, heavy foods. If nausea and/or vomiting occur, drink only clear liquids until the nausea and/or vomiting subsides. Call your physician if vomiting continues.  Special Instructions/Symptoms: Your throat may feel dry or sore from the anesthesia or the breathing tube placed in your throat during surgery. If this causes discomfort, gargle with warm salt water. The discomfort should disappear within 24 hours.   Regional Anesthesia Blocks  1. Numbness or the inability to move the "blocked" extremity may last from 3-48 hours after placement. The length of time depends on the medication injected and your individual response to the medication. If the numbness is not going away after 48 hours, call your surgeon.  2. The extremity that is blocked will need to be protected until the numbness is gone and the  Strength has returned. Because you cannot feel it, you will need to take extra care to avoid injury. Because it may be weak, you may have difficulty moving it or using it. You may not know what position it is in without looking at it while the block is in effect.  3. For blocks in the legs and feet, returning to weight bearing and walking needs to be done carefully. You will need to wait until the numbness is entirely gone and the strength has returned. You should be able to move your leg and foot normally before you try and bear  weight or walk. You will need someone to be with you when you first try to ensure you do not fall and possibly risk injury.  4. Bruising and tenderness at the needle site are common side effects and will resolve in a few days.  5. Persistent numbness or new problems with movement should be communicated to the surgeon or the [facility] [place] [phone] [place] [phone]).

## 2013-09-10 NOTE — Progress Notes (Signed)
Assisted Dr. Edmond Fitzgerald with left, ultrasound guided, popliteal block. Side rails up, monitors on throughout procedure. See vital signs in flow sheet. Tolerated Procedure well. °

## 2013-09-10 NOTE — H&P (Signed)
      ORTHOPAEDIC CONSULTATION  REQUESTING PHYSICIAN: Renette Butters, MD  Chief Complaint: Left ankle fracture  HPI: Angela Wood is a 48 y.o. female who complains of  Mechanical fall  Past Medical History  Diagnosis Date  . Wears glasses    Past Surgical History  Procedure Laterality Date  . Rotator cuff repair  2002    lt  . Ankle surgery  2011    rt tendon repair  . Leep    . Dilation and curettage of uterus     History   Social History  . Marital Status: Married    Spouse Name: N/A    Number of Children: 2  . Years of Education: N/A   Occupational History  . Production Tech    Social History Main Topics  . Smoking status: Current Every Day Smoker -- 1.00 packs/day  . Smokeless tobacco: Never Used  . Alcohol Use: Yes     Comment: occ  . Drug Use: No  . Sexual Activity: None   Other Topics Concern  . None   Social History Narrative  . None   Family History  Problem Relation Age of Onset  . Heart disease Father    Allergies  Allergen Reactions  . Codeine Hives  . Sulfur Hives   Prior to Admission medications   Medication Sig Start Date End Date Taking? Authorizing Provider  HYDROcodone-acetaminophen (NORCO) 5-325 MG per tablet Take 1-2 tablets by mouth every 6 (six) hours as needed for severe pain. 09/05/13  Yes Mercedes Strupp Camprubi-Soms, PA-C  Melatonin 5 MG TABS Take 5 mg by mouth at bedtime.   Yes Historical Provider, MD  naproxen (NAPROSYN) 500 MG tablet Take 1 tablet (500 mg total) by mouth 2 (two) times daily as needed for mild pain, moderate pain or headache (TAKE WITH MEALS.). 09/05/13  Yes Mercedes Strupp Camprubi-Soms, PA-C   No results found.  Positive ROS: All other systems have been reviewed and were otherwise negative with the exception of those mentioned in the HPI and as above.  Labs cbc No results found for this basename: WBC, HGB, HCT, PLT,  in the last 72 hours  Labs inflam No results found for this basename: ESR, CRP,   in the last 72 hours  Labs coag No results found for this basename: INR, PT, PTT,  in the last 72 hours  No results found for this basename: NA, K, CL, CO2, GLUCOSE, BUN, CREATININE, CALCIUM,  in the last 72 hours  Physical Exam: There were no vitals filed for this visit. General: Alert, no acute distress Cardiovascular: No pedal edema Respiratory: No cyanosis, no use of accessory musculature GI: No organomegaly, abdomen is soft and non-tender Skin: No lesions in the area of chief complaint other than those listed below in MSK exam.  Neurologic: Sensation intact distally Psychiatric: Patient is competent for consent with normal mood and affect Lymphatic: No axillary or cervical lymphadenopathy  MUSCULOSKELETAL:  LLE: NVI, splint intact Other extremities are atraumatic with painless ROM and NVI.  Assessment: ORIF L ankle  Plan: ORIF    Edmonia Lynch, D, MD Cell [phone]   09/10/2013 7:38 AM

## 2013-09-10 NOTE — Anesthesia Postprocedure Evaluation (Signed)
  Anesthesia Post-op Note  Patient: Angela Wood  Procedure(s) Performed: Procedure(s): OPEN REDUCTION INTERNAL FIXATION (ORIF) DISTAL  FIBULA AND SYNDESMOSIS (Left)  Patient Location: PACU  Anesthesia Type:General and block  Level of Consciousness: awake and alert   Airway and Oxygen Therapy: Patient Spontanous Breathing  Post-op Pain: none  Post-op Assessment: Post-op Vital signs reviewed, Patient's Cardiovascular Status Stable and Respiratory Function Stable  Post-op Vital Signs: Reviewed  Filed Vitals:   09/10/13 1015  BP: 135/61  Pulse: 77  Temp:   Resp: 14    Complications: No apparent anesthesia complications

## 2013-09-10 NOTE — Op Note (Signed)
09/10/2013  9:06 AM  PATIENT:  Angela Wood    PRE-OPERATIVE DIAGNOSIS:  LEFT ANKLE FRACTURE LATERAL MALLEOULUS/DISTAL FIBULA CLOSED FRACTURE ANKLE  POST-OPERATIVE DIAGNOSIS:  Same  PROCEDURE:  OPEN REDUCTION INTERNAL FIXATION (ORIF) DISTAL  FIBULA AND SYNDESMOSIS  SURGEON:  Renette Butters, MD  ASSISTANT: Joya Gaskins, OPA, He was necessary for efficiency and safety of the case.   ANESTHESIA:   Gen, plus a block  PREOPERATIVE INDICATIONS:  Angela Wood is a  49 y.o. female with a diagnosis of LEFT ANKLE FRACTURE LATERAL MALLEOULUS/DISTAL [place] who failed conservative measures and elected for surgical management.    The risks benefits and alternatives were discussed with the patient preoperatively including but not limited to the risks of infection, bleeding, nerve injury, cardiopulmonary complications, the need for revision surgery, among others, and the patient was willing to proceed.  OPERATIVE IMPLANTS: 1/3 tubular plate  OPERATIVE FINDINGS: Unstable ankle fracture. Stable syndesmosis post op  BLOOD LOSS: min   COMPLICATIONS: none  TOURNIQUET TIME: 30  OPERATIVE PROCEDURE:  Patient was identified in the preoperative holding area and site was marked by me He was transported to the operating theater and placed on the table in supine position taking care to pad all bony prominences. After a preincinduction time out anesthesia was induced. The left lower extremity was prepped and draped in normal sterile fashion and a pre-incision timeout was performed. Angela Wood received ancef for preoperative antibiotics.   I made a lateral incision of roughly 7 cm dissection was carried down sharply to the distal fibula and then spreading dissection was used proximally to protect the superficial peroneal nerve. I sharply incised the periosteum and took care to protect the peroneal tendons. I then debrided the fracture site and performed a reduction maneuver  which was held in place with a clamp.   I placed a lag screw across the fracture  I then selected a 5-hole one third tubular plate and placed in a neutralization fashion care was taken distally so as not to penetrate the joint with the cancellus screws.  I then stressed the syndesmosis and it was stable  The wound was then thoroughly irrigated and closed using a 0 Vicryl and absorbable Monocryl sutures. He was placed in a short leg splint.   POST OPERATIVE PLAN: Non-weightbearing. DVT prophylaxis will consist of ASA81 daily  This note was generated using a template and dragon dictation system. In light of that, I have reviewed the note and all aspects of it are applicable to this case. Any dictation errors are due to the computerized dictation system.

## 2013-09-10 NOTE — Transfer of Care (Signed)
Immediate Anesthesia Transfer of Care Note  Patient: Angela Wood  Procedure(s) Performed: Procedure(s): OPEN REDUCTION INTERNAL FIXATION (ORIF) DISTAL  FIBULA AND SYNDESMOSIS (Left)  Patient Location: PACU  Anesthesia Type:General and GA combined with regional for post-op pain  Level of Consciousness: awake and alert   Airway & Oxygen Therapy: Patient Spontanous Breathing and Patient connected to face mask oxygen  Post-op Assessment: Report given to PACU RN and Post -op Vital signs reviewed and stable  Post vital signs: Reviewed and stable  Complications: No apparent anesthesia complications

## 2013-09-10 NOTE — Anesthesia Procedure Notes (Addendum)
Anesthesia Regional Block:  Popliteal block  Pre-Anesthetic Checklist: ,, timeout performed, Correct Patient, Correct Site, Correct Laterality, Correct Procedure, Correct Position, site marked, Risks and benefits discussed, pre-op evaluation, post-op pain management  Laterality: Left  Prep: Maximum Sterile Barrier Precautions used and chloraprep       Needles:  Injection technique: Single-shot  Needle Type: Echogenic Stimulator Needle     Needle Length: 9cm 9 cm Needle Gauge: 21 and 21 G    Additional Needles:  Procedures: ultrasound guided (picture in chart) and nerve stimulator Popliteal block  Nerve Stimulator or Paresthesia:  Response: Peroneal,  Response: Tibial,   Additional Responses:   Narrative:  Start time: 09/10/2013 8:01 AM End time: 09/10/2013 8:12 AM Injection made incrementally with aspirations every 5 mL. Anesthesiologist: Ola Spurr, MD  Additional Notes: 2% Lidocaine skin wheel.    Procedure Name: LMA Insertion Date/Time: 09/10/2013 8:29 AM Performed by: Lieutenant Diego Pre-anesthesia Checklist: Patient identified, Emergency Drugs available, Suction available and Patient being monitored Patient Re-evaluated:Patient Re-evaluated prior to inductionOxygen Delivery Method: Circle System Utilized Preoxygenation: Pre-oxygenation with 100% oxygen Intubation Type: IV induction Ventilation: Mask ventilation without difficulty LMA: LMA inserted LMA Size: 4.0 Number of attempts: 1 Airway Equipment and Method: bite block Placement Confirmation: positive ETCO2 and breath sounds checked- equal and bilateral Tube secured with: Tape Dental Injury: Teeth and Oropharynx as per pre-operative assessment

## 2013-09-10 NOTE — Anesthesia Preprocedure Evaluation (Addendum)
Anesthesia Evaluation  Patient identified by MRN, date of birth, ID band Patient awake    Reviewed: Allergy & Precautions, H&P , NPO status , Patient's Chart, lab work & pertinent test results  Airway Mallampati: II TM Distance: >3 FB Neck ROM: Full    Dental no notable dental hx. (+) Teeth Intact, Dental Advisory Given   Pulmonary Current Smoker,  breath sounds clear to auscultation  Pulmonary exam normal       Cardiovascular negative cardio ROS  Rhythm:Regular Rate:Normal     Neuro/Psych negative neurological ROS  negative psych ROS   GI/Hepatic negative GI ROS, Neg liver ROS,   Endo/Other  negative endocrine ROS  Renal/GU negative Renal ROS  negative genitourinary   Musculoskeletal   Abdominal   Peds  Hematology negative hematology ROS (+)   Anesthesia Other Findings   Reproductive/Obstetrics negative OB ROS                          Anesthesia Physical Anesthesia Plan  ASA: II  Anesthesia Plan: General and Regional   Post-op Pain Management:    Induction: Intravenous  Airway Management Planned: LMA  Additional Equipment:   Intra-op Plan:   Post-operative Plan: Extubation in OR  Informed Consent: I have reviewed the patients History and Physical, chart, labs and discussed the procedure including the risks, benefits and alternatives for the proposed anesthesia with the patient or authorized representative who has indicated his/her understanding and acceptance.   Dental advisory given  Plan Discussed with: CRNA  Anesthesia Plan Comments:         Anesthesia Quick Evaluation

## 2013-09-11 ENCOUNTER — Encounter (HOSPITAL_BASED_OUTPATIENT_CLINIC_OR_DEPARTMENT_OTHER): Payer: Self-pay | Admitting: Orthopedic Surgery

## 2015-10-06 IMAGING — CR DG ANKLE COMPLETE 3+V*L*
3 series · 3 of 3 positions shown · non-contrast
Comparison: None.

CLINICAL DATA: Ankle pain and swelling.  Fall.

EXAM:
LEFT ANKLE COMPLETE - 3+ VIEW

[t ankle joint ap left]
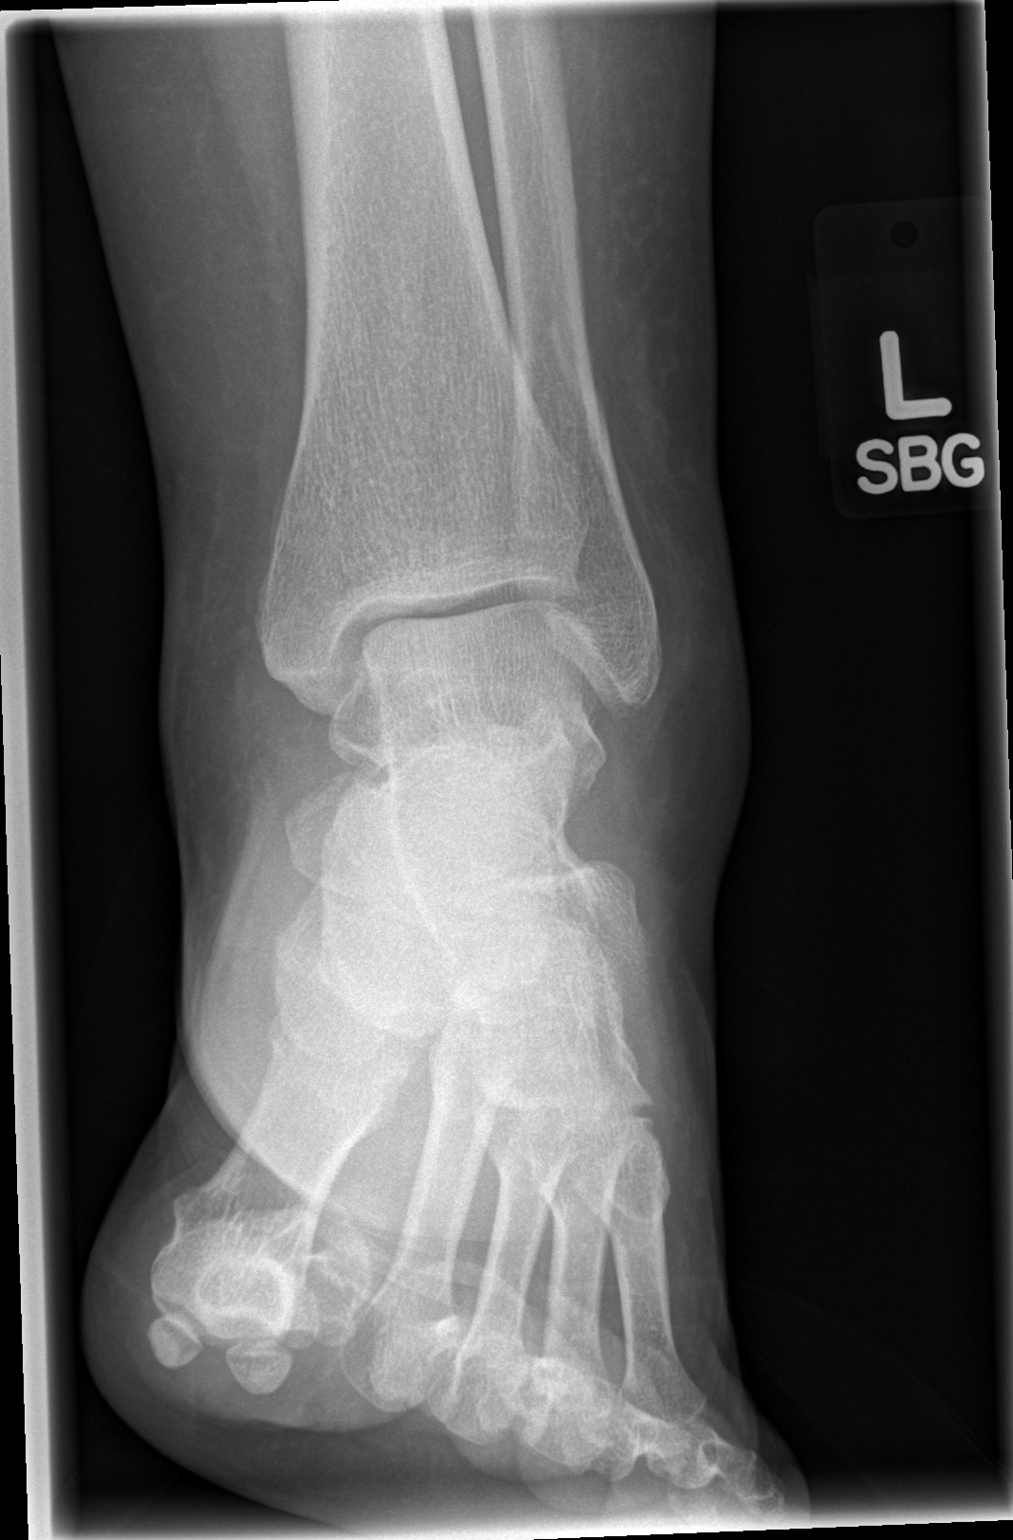

[t ankle joint oblique left]
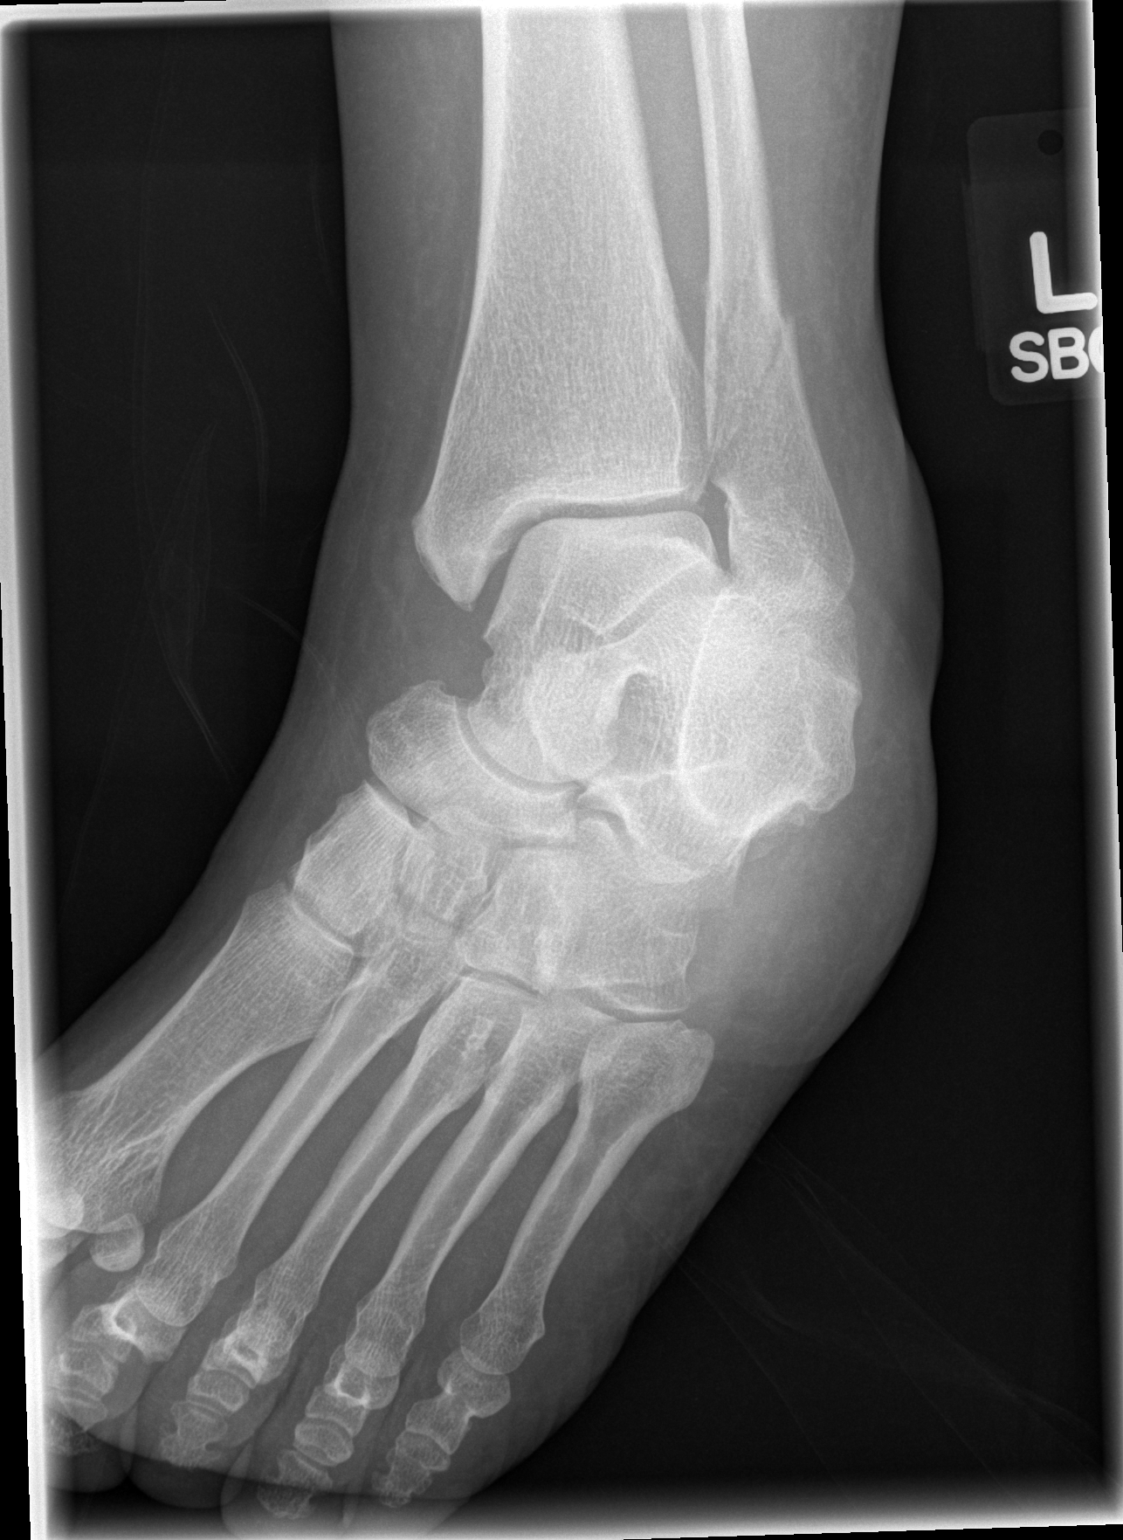

[t ankle joint lat left]
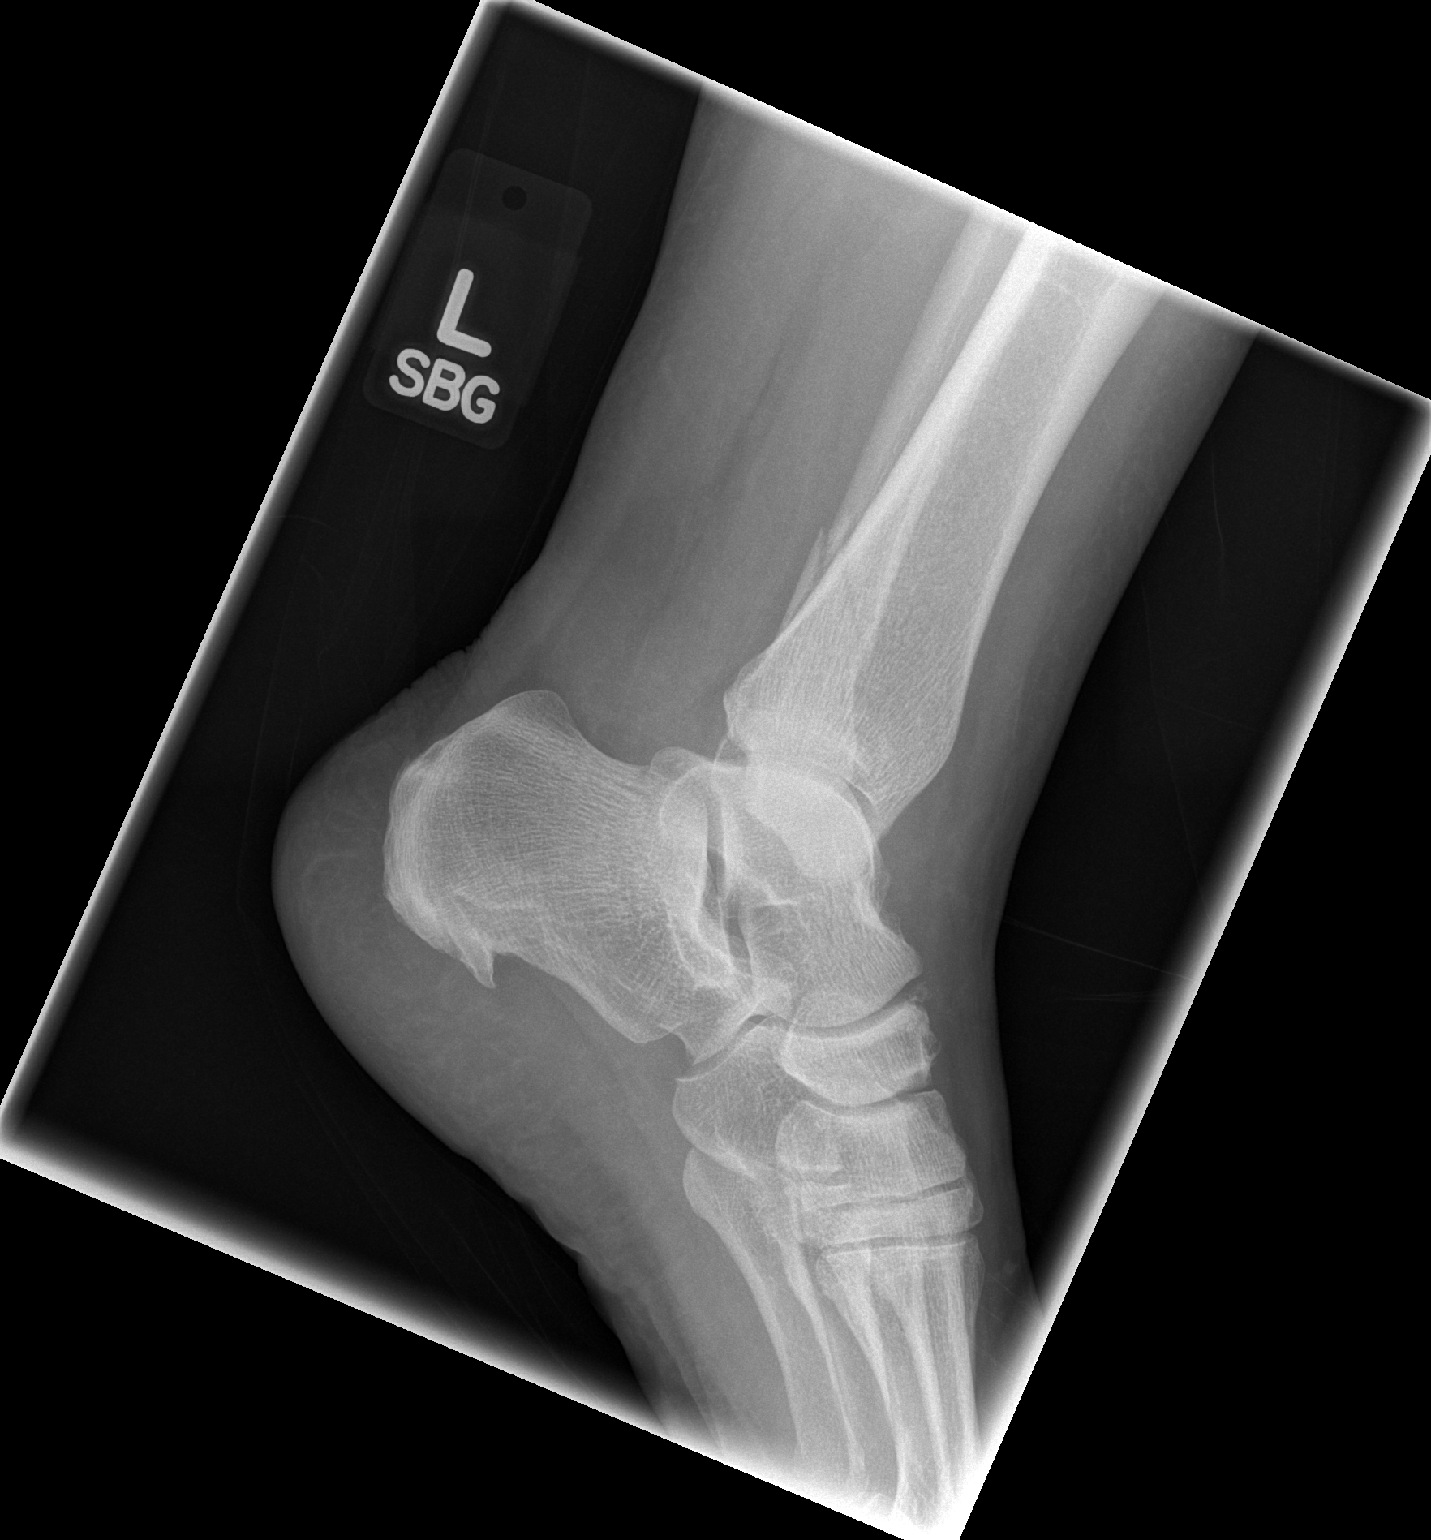

[3 of 3 positions shown; findings below may reference images not displayed]

FINDINGS: There is an oblique fracture in the distal fibula extending into the
ankle joint. The fracture begins at the tibial plafond and then
extends superiorly and posteriorly. Medial malleolus and posterior
malleolus are intact. Talus and calcaneus are intact. Spur at the
inferior calcaneus.
IMPRESSION: Lateral malleolus fracture.  Weber B injury.

## 2015-10-06 IMAGING — CR DG ANKLE COMPLETE 3+V*R*
3 series · 3 of 3 positions shown · non-contrast
Comparison: None.

CLINICAL DATA: Right ankle pain.  Fall.

EXAM:
RIGHT ANKLE - COMPLETE 3+ VIEW

[t ankle joint ap right]
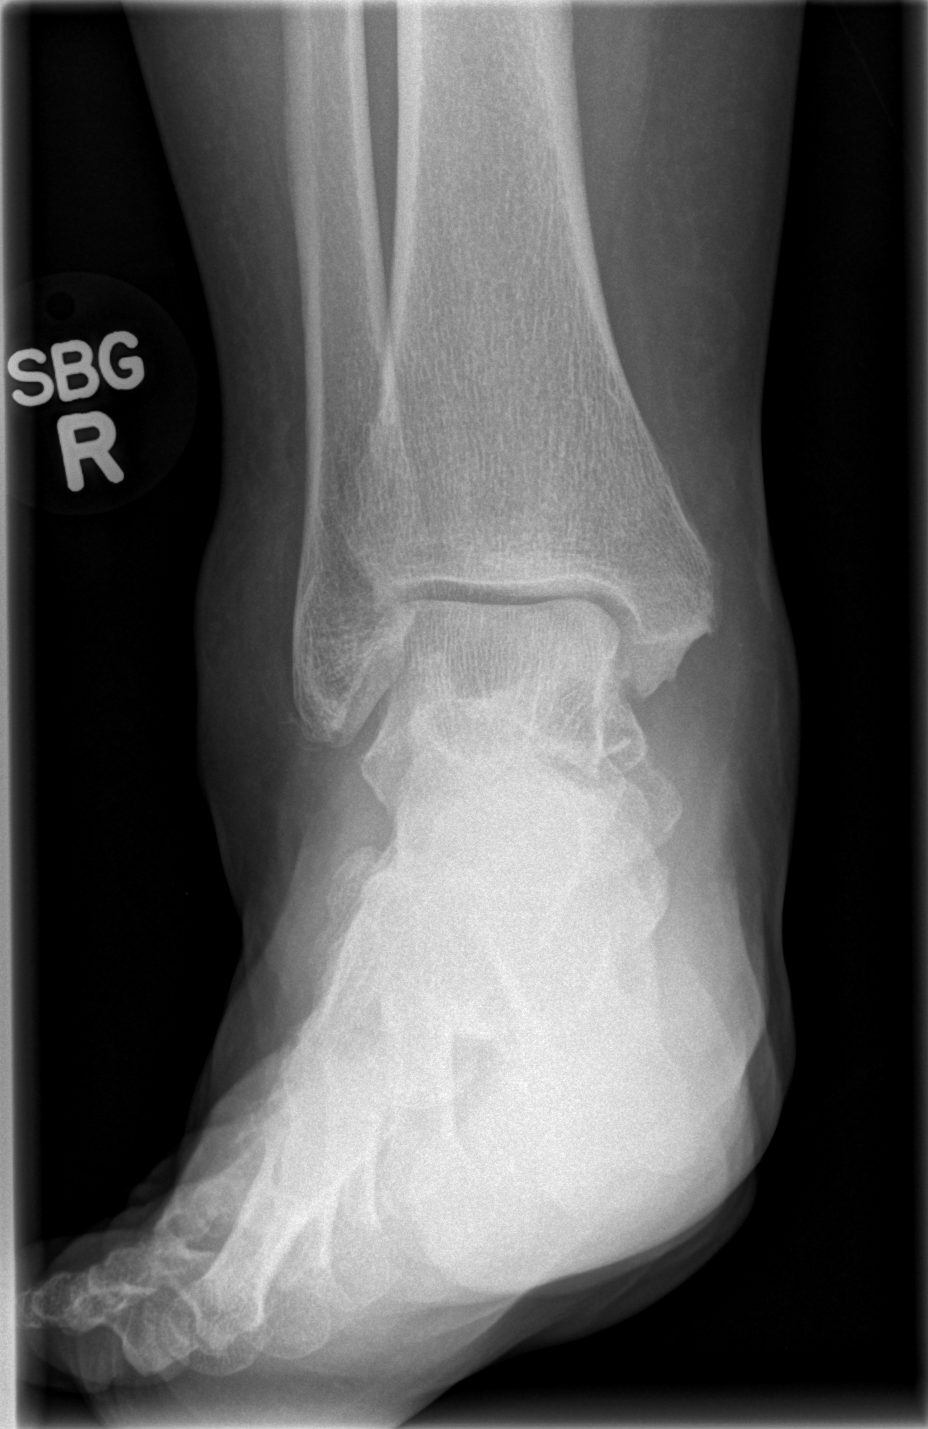

[t ankle joint lat right]
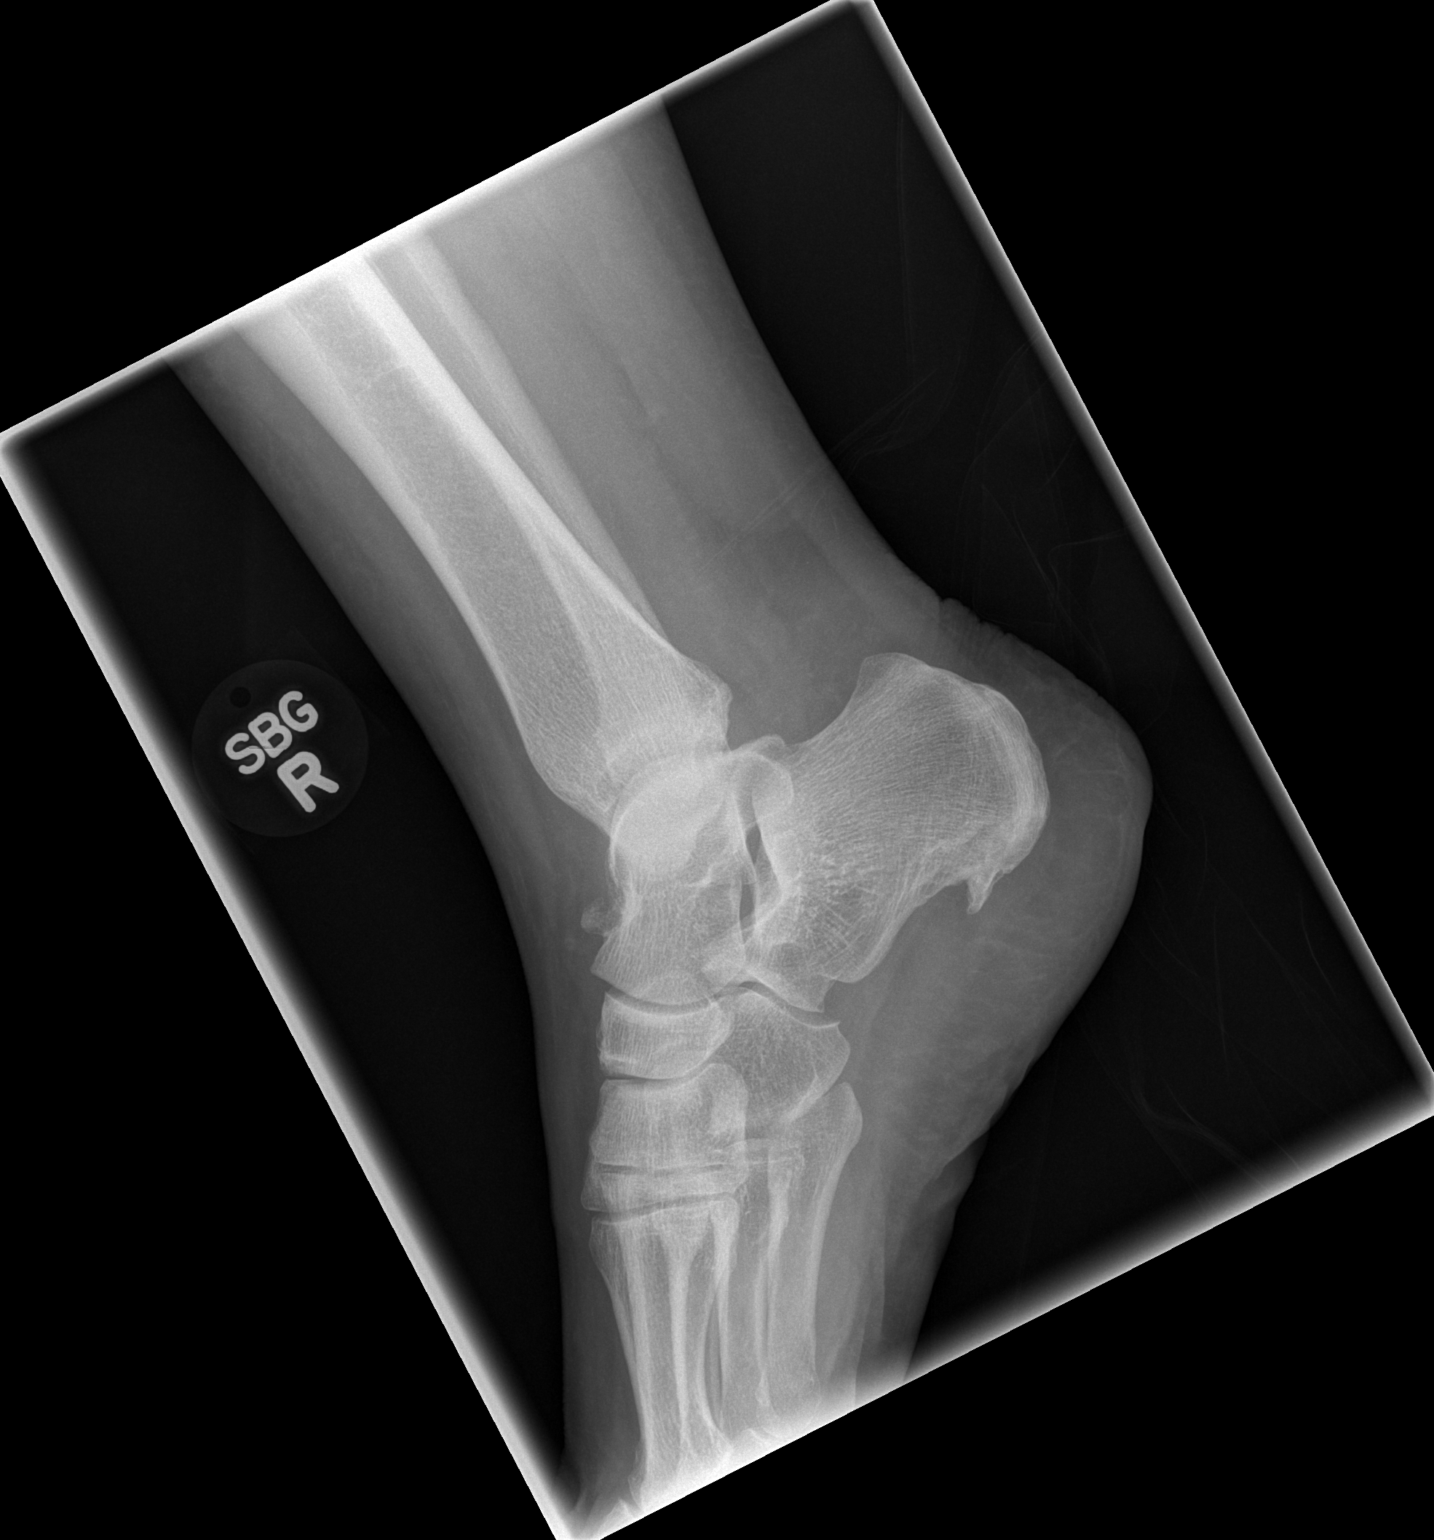

[t ankle joint oblique right]
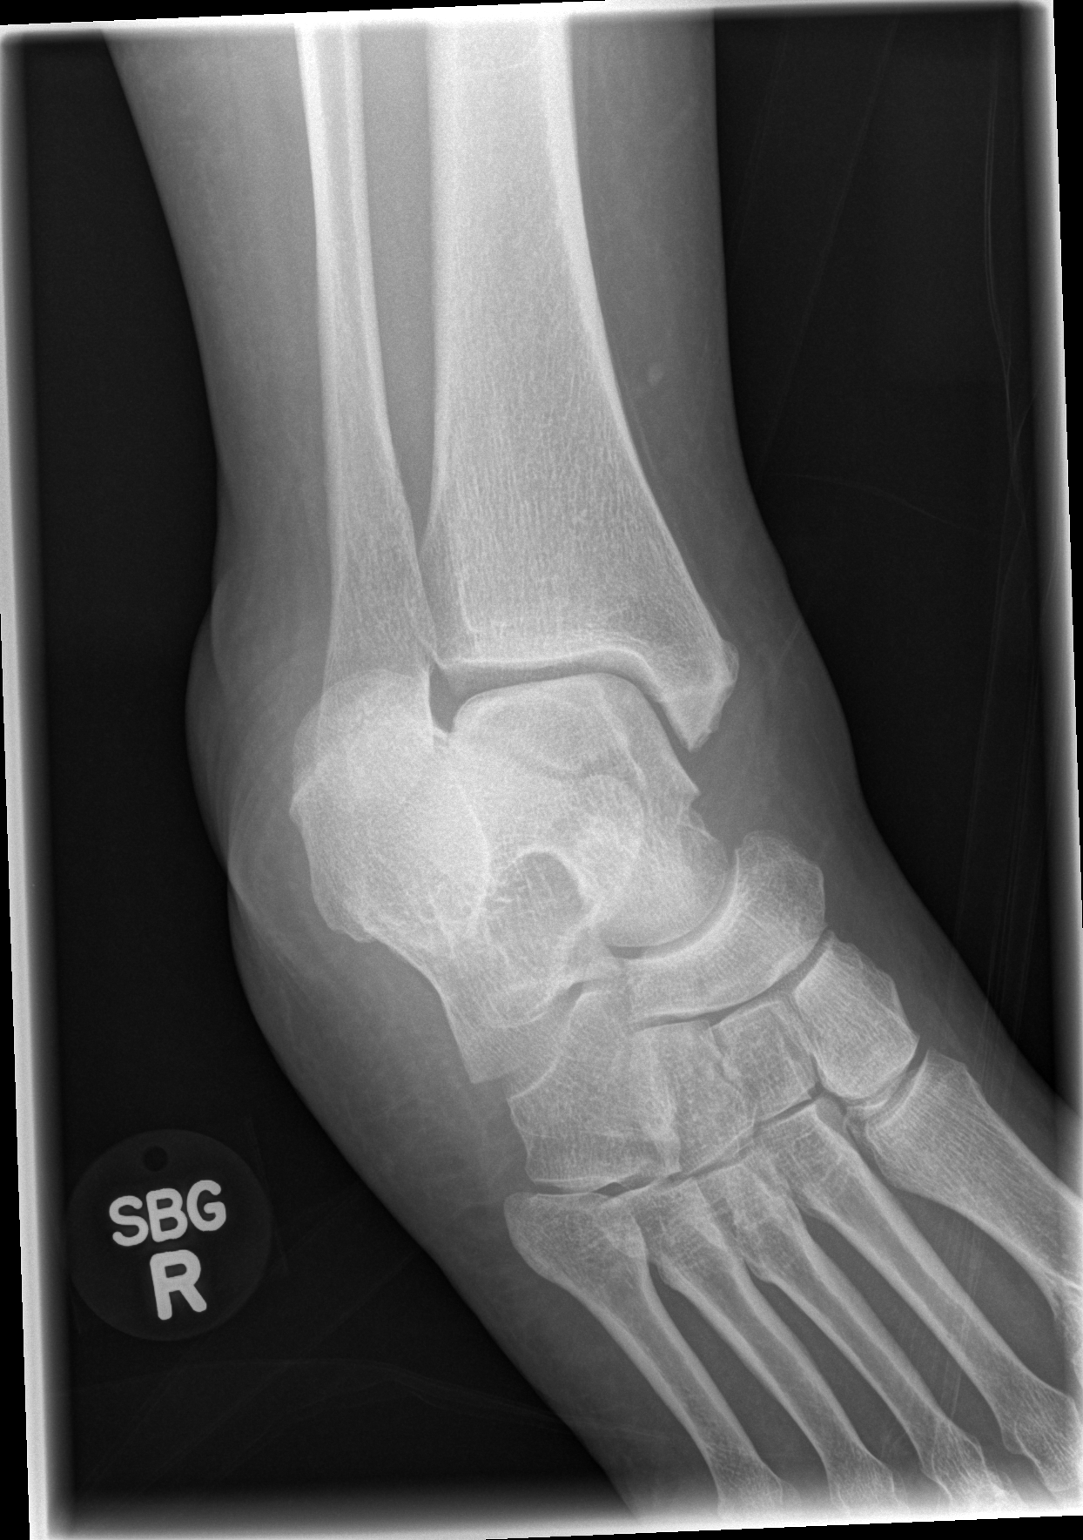

[3 of 3 positions shown; findings below may reference images not displayed]

FINDINGS: There is a small bony density dorsal to the talar neck which has a
chronic appearance. Spurring at the inferior calcaneus. Small bony
densities adjacent to the tip of the fibula are present. Avulsion
fractures of indeterminate age are not excluded. There is associated
soft tissue swelling about the lateral malleolus. Tibia is intact.
Spurring at the inferior calcaneus.
IMPRESSION: Small avulsion fracture from the inferior tip of the fibula is not
excluded.

## 2017-08-09 ENCOUNTER — Encounter: Payer: Self-pay | Admitting: Gastroenterology

## 2017-09-23 ENCOUNTER — Telehealth: Payer: Self-pay | Admitting: *Deleted

## 2017-09-23 ENCOUNTER — Ambulatory Visit (AMBULATORY_SURGERY_CENTER): Payer: Self-pay | Admitting: *Deleted

## 2017-09-23 ENCOUNTER — Encounter: Payer: Self-pay | Admitting: Gastroenterology

## 2017-09-23 VITALS — Ht 63.0 in | Wt 160.0 lb

## 2017-09-23 DIAGNOSIS — Z86010 Personal history of colonic polyps: Secondary | ICD-10-CM

## 2017-09-23 MED ORDER — ONDANSETRON HCL 4 MG PO TABS: 4.0000 mg | ORAL_TABLET | ORAL | 0 refills | Status: DC

## 2017-09-23 MED ORDER — NA SULFATE-K SULFATE-MG SULF 17.5-3.13-1.6 GM/177ML PO SOLN: 1.0000 | Freq: Once | ORAL | 0 refills | Status: AC

## 2017-09-23 NOTE — Progress Notes (Signed)
No egg or soy allergy known to patient  No issues with past sedation with any surgeries  or procedures, no intubation problems  No diet pills per patient No home 02 use per patient  No blood thinners per patient  Pt denies issues with constipation  No A fib or A flutter  EMMI video sent to pt's e mail pt declined Suprep $15 coupon to pt in [place] Movi prep with last colon made pt vomit especially morning does day of the procedure- can she have nausea meds to kn

## 2017-09-23 NOTE — Telephone Encounter (Signed)
Dr Silverio Decamp,  I saw Mrs Angela Wood in [place] today. The  Movi prep she took  with last colon made pt vomit especially morning dose day of the procedure- can she have nausea meds for this colon?  Her procedure is  10-7 Monday   Thanks, Lelan Pons

## 2017-09-23 NOTE — Telephone Encounter (Signed)
Yes ok to send Zofrn 4mg  Q8h ODT prn. Thanks

## 2017-09-23 NOTE — Telephone Encounter (Signed)
Sent in Rx for Zofran- called pt and made her aware to use 30 minutes prior to each prep dose= she verbalized understanding

## 2017-10-07 ENCOUNTER — Ambulatory Visit (AMBULATORY_SURGERY_CENTER): Payer: 59 | Admitting: Gastroenterology

## 2017-10-07 ENCOUNTER — Encounter: Payer: Self-pay | Admitting: Gastroenterology

## 2017-10-07 VITALS — BP 119/65 | HR 69 | Temp 98.7°F | Resp 13 | Ht 61.0 in | Wt 145.0 lb

## 2017-10-07 DIAGNOSIS — Z86010 Personal history of colonic polyps: Secondary | ICD-10-CM

## 2017-10-07 DIAGNOSIS — K621 Rectal polyp: Secondary | ICD-10-CM | POA: Diagnosis not present

## 2017-10-07 DIAGNOSIS — D128 Benign neoplasm of rectum: Secondary | ICD-10-CM

## 2017-10-07 MED ORDER — SODIUM CHLORIDE 0.9 % IV SOLN: 500.0000 mL | Freq: Once | INTRAVENOUS | Status: DC

## 2017-10-07 NOTE — Progress Notes (Signed)
To recovery, report to RN, VSS. 

## 2017-10-07 NOTE — Progress Notes (Signed)
Pt's states no medical or surgical changes since previsit or office visit. 

## 2017-10-07 NOTE — Progress Notes (Signed)
Called to room to assist during endoscopic procedure.  Patient ID and intended procedure confirmed with present staff. Received instructions for my participation in the procedure from the performing physician.  

## 2017-10-07 NOTE — Op Note (Signed)
[facility] Patient Name: Angela Wood Procedure Date: 10/07/2017 11:12 AM MRN: [id number] Endoscopist: Mauri Pole , MD Age: 52 Referring MD:  Date of Birth: 1965-12-29 Gender: Female Account #: [id number] Procedure:                Colonoscopy Indications:              High risk colon cancer surveillance: Personal                            history of adenoma less than 10 mm in size Medicines:                Monitored Anesthesia Care Procedure:                Pre-Anesthesia Assessment:                           - Prior to the procedure, a History and Physical                            was performed, and patient medications and                            allergies were reviewed. The patient's tolerance of                            previous anesthesia was also reviewed. The risks                            and benefits of the procedure and the sedation                            options and risks were discussed with the patient.                            All questions were answered, and informed consent                            was obtained. Prior Anticoagulants: The patient has                            taken no previous anticoagulant or antiplatelet                            agents. ASA Grade Assessment: II - A patient with                            mild systemic disease. After reviewing the risks                            and benefits, the patient was deemed in                            satisfactory condition to undergo the procedure.  After obtaining informed consent, the colonoscope                            was passed under direct vision. Throughout the                            procedure, the patient's blood pressure, pulse, and                            oxygen saturations were monitored continuously. The                            Model PCF-H190DL [id number]) scope was introduced                            through the  anus and advanced to the the cecum,                            identified by appendiceal orifice and ileocecal                            valve. The colonoscopy was performed without                            difficulty. The patient tolerated the procedure                            well. The quality of the bowel preparation was                            adequate. The appendiceal orifice, and rectum were                            photographed. Ileocecal valve photograph not saved                            in error. Scope In: 11:19:59 AM Scope Out: 11:37:40 AM Scope Withdrawal Time: 0 hours 14 minutes 34 seconds  Total Procedure Duration: 0 hours 17 minutes 41 seconds  Findings:                 The perianal and digital rectal examinations were                            normal.                           A 3 mm polyp was found in the rectum. The polyp was                            sessile. The polyp was removed with a cold biopsy                            forceps. Resection and retrieval were complete.  Multiple small and large-mouthed diverticula were                            found in the sigmoid colon, descending colon and                            ascending colon.                           Non-bleeding internal hemorrhoids were found during                            retroflexion. The hemorrhoids were medium-sized. Complications:            No immediate complications. Estimated Blood Loss:     Estimated blood loss was minimal. Impression:               - One 3 mm polyp in the rectum, removed with a cold                            biopsy forceps. Resected and retrieved.                           - Diverticulosis in the sigmoid colon, in the                            descending colon and in the ascending colon.                           - Non-bleeding internal hemorrhoids. Recommendation:           - Patient has a contact number available for                             emergencies. The signs and symptoms of potential                            delayed complications were discussed with the                            patient. Return to normal activities tomorrow.                            Written discharge instructions were provided to the                            patient.                           - Resume previous diet.                           - Continue present medications.                           - Await pathology results.                           -  Repeat colonoscopy in 5-10 years for surveillance                            based on pathology results. Mauri Pole, MD 10/07/2017 11:40:49 AM This report has been signed electronically.

## 2017-10-08 ENCOUNTER — Telehealth: Payer: Self-pay | Admitting: *Deleted

## 2017-10-08 NOTE — Telephone Encounter (Signed)
  Follow up Call-  Call back number 10/07/2017  Post procedure Call Back phone  # [phone]  Permission to leave phone message Yes  Some recent data might be hidden     Patient questions:  Do you have a fever, pain , or abdominal swelling? No. Pain Score  0 *  Have you tolerated food without any problems? Yes.    Have you been able to return to your normal activities? Yes.    Do you have any questions about your discharge instructions: Diet   No. Medications  No. Follow up visit  No.  Do you have questions or concerns about your Care? No.  Actions: * If pain score is 4 or above: No action needed, pain <4.

## 2017-10-15 ENCOUNTER — Encounter: Payer: Self-pay | Admitting: Gastroenterology

## 2018-02-04 ENCOUNTER — Ambulatory Visit: Payer: 59 | Admitting: Podiatry

## 2018-02-14 ENCOUNTER — Ambulatory Visit: Payer: 59 | Admitting: Podiatry

## 2018-02-14 ENCOUNTER — Encounter: Payer: Self-pay | Admitting: Podiatry

## 2018-02-14 DIAGNOSIS — Q828 Other specified congenital malformations of skin: Secondary | ICD-10-CM

## 2018-07-13 NOTE — Progress Notes (Signed)
  Subjective:  Patient ID: Angela Wood, female    DOB: [birth year],  MRN: [id number]  Chief Complaint  Patient presents with  . Foot Pain    Plantar midfoot left - tiny, callused area x 1.5 months, walking a lot makes hurt, no treatment  . New Patient (Initial Visit)    53 y.o. female presents with the above complaint. Hx as above.  Review of Systems: Negative except as noted in the HPI. Denies N/V/F/Ch.  Past Medical History:  Diagnosis Date  . Hx of adenomatous colonic polyps 2014   TA x 1  . Wears glasses     Current Outpatient Medications:  .  Coenzyme Q10 60 MG CAPS, Take by mouth., Disp: , Rfl:  .  KRILL OIL PO, Take by mouth., Disp: , Rfl:  .  magnesium 30 MG tablet, Take by mouth., Disp: , Rfl:  .  potassium chloride (K-DUR,KLOR-CON) 10 MEQ tablet, Take by mouth., Disp: , Rfl:   Social History   Tobacco Use  Smoking Status Current Every Day Smoker  . Packs/day: 1.00  Smokeless Tobacco Never Used    Allergies  Allergen Reactions  . Codeine Hives  . Sulfur Hives   Objective:  There were no vitals filed for this visit. There is no height or weight on file to calculate BMI. Constitutional Well developed. Well nourished.  Vascular Dorsalis pedis pulses palpable bilaterally. Posterior tibial pulses palpable bilaterally. Capillary refill normal to all digits.  No cyanosis or clubbing noted. Pedal hair growth normal.  Neurologic Normal speech. Oriented to person, place, and time. Epicritic sensation to light touch grossly present bilaterally.  Dermatologic Nails nomrla Skin punctate keratosis left plantar midfoot.  Orthopedic: Normal joint ROM without pain or crepitus bilaterally. No visible deformities. No bony tenderness.   Radiographs: None Assessment:   1. Porokeratosis    Plan:  Patient was evaluated and treated and all questions answered.  Porokeratosis -Debrided with 312 blade, destroyed with salinocaine -Educated on self-care. F/u should  it recur.  Return if symptoms worsen or fail to improve.

## 2020-01-05 ENCOUNTER — Emergency Department (HOSPITAL_COMMUNITY)
Admission: EM | Admit: 2020-01-05 | Discharge: 2020-01-05 | Disposition: A | Discharge status: Home / Self Care | Payer: 59 | Attending: Emergency Medicine | Admitting: Emergency Medicine

## 2020-01-05 ENCOUNTER — Other Ambulatory Visit: Payer: Self-pay

## 2020-01-05 ENCOUNTER — Encounter (HOSPITAL_COMMUNITY): Payer: Self-pay | Admitting: *Deleted

## 2020-01-05 DIAGNOSIS — F17200 Nicotine dependence, unspecified, uncomplicated: Secondary | ICD-10-CM | POA: Diagnosis not present

## 2020-01-05 DIAGNOSIS — L509 Urticaria, unspecified: Secondary | ICD-10-CM

## 2020-01-05 DIAGNOSIS — Z86010 Personal history of colonic polyps: Secondary | ICD-10-CM | POA: Insufficient documentation

## 2020-01-05 DIAGNOSIS — R21 Rash and other nonspecific skin eruption: Secondary | ICD-10-CM | POA: Diagnosis present

## 2020-01-05 MED ORDER — DIPHENHYDRAMINE HCL 50 MG/ML IJ SOLN
12.5000 mg | Freq: Once | INTRAMUSCULAR | Status: AC
  Administered 2020-01-05: 12.5 mg via INTRAVENOUS
  Filled 2020-01-05: qty 1

## 2020-01-05 MED ORDER — DEXAMETHASONE SODIUM PHOSPHATE 10 MG/ML IJ SOLN
8.0000 mg | Freq: Once | INTRAMUSCULAR | Status: AC
  Administered 2020-01-05: 8 mg via INTRAVENOUS
  Filled 2020-01-05: qty 1

## 2020-01-05 MED ORDER — FAMOTIDINE IN NACL 20-0.9 MG/50ML-% IV SOLN
20.0000 mg | Freq: Once | INTRAVENOUS | Status: AC
  Administered 2020-01-05: 20 mg via INTRAVENOUS
  Filled 2020-01-05: qty 50

## 2020-01-05 NOTE — ED Provider Notes (Signed)
[facility] EMERGENCY DEPARTMENT Provider Note   CSN: [id number] Arrival date & time: 01/05/20  1257     History Chief Complaint  Patient presents with  . Allergic Reaction    Angela Wood is a 55 y.o. female.  HPI      Angela Wood is a 55 y.o. female who presents to the Emergency Department complaining of itching and rash since 01/02/2020 she states that she developed redness along her abdomen and lower back with raised red welts after putting away her Christmas decorations in the attic.  She states she was exposed to fiberglass insulation.  She took Benadryl and symptoms seem to be improving, but worsened the following day.  She woke up that night to go to the restroom and notes having a brief syncopal episode in which she woke up on her living room floor and was incontinent of urine.  Denies head injury or headache, confusion afterwards.  No associated symptoms since then.  She was seen earlier today at an urgent care and given an IM injection of Depo-Medrol and prescription for oral steroids.  She states that when she returned home she noticed increased redness and a burning stinging sensation to her skin.  Symptoms seem to be worse to both arms, lower legs, and trunk.  She also notes feeling a "lump" in her throat.  She denies difficulty swallowing, shortness of breath, chest pain, swelling of her face lips or tongue.  She notes having prior allergic reactions to different medications, but no known history of anaphylaxis.  She last took benadryl this morning at 3:30 am.  Denies recent new medications, foods or chemical exposures.    Past Medical History:  Diagnosis Date  . Hx of adenomatous colonic polyps 2014   TA x 1  . Wears glasses     There are no problems to display for this patient.   Past Surgical History:  Procedure Laterality Date  . ANKLE SURGERY  2011   rt tendon repair  . COLONOSCOPY    . DILATION AND CURETTAGE OF UTERUS    . LEEP    . MENISCUS REPAIR     . ORIF ANKLE FRACTURE Left 09/10/2013   Procedure: OPEN REDUCTION INTERNAL FIXATION (ORIF) DISTAL  FIBULA AND SYNDESMOSIS;  Surgeon: Sheral Apley, MD;  Location: [facility];  Service: Orthopedics;  Laterality: Left;  . POLYPECTOMY    . ROTATOR CUFF REPAIR  2002   lt     OB History   No obstetric history on file.     Family History  Problem Relation Age of Onset  . Heart disease Father   . Colon cancer Neg Hx   . Colon polyps Neg Hx   . Esophageal cancer Neg Hx   . Rectal cancer Neg Hx   . Stomach cancer Neg Hx     Social History   Tobacco Use  . Smoking status: Current Every Day Smoker    Packs/day: 1.00  . Smokeless tobacco: Never Used  Substance Use Topics  . Alcohol use: Yes    Comment: occ  . Drug use: No    Home Medications Prior to Admission medications   Medication Sig Start Date End Date Taking? Authorizing Provider  Coenzyme Q10 60 MG CAPS Take by mouth.    [provider]  KRILL OIL PO Take by mouth.    [provider]  magnesium 30 MG tablet Take by mouth.    [provider]  potassium chloride (K-DUR,KLOR-CON) 10  MEQ tablet Take by mouth.    [provider]    Allergies    Codeine and Sulfur  Review of Systems   Review of Systems  Constitutional: Negative for activity change, appetite change, chills and fever.  HENT: Positive for sore throat. Negative for congestion, facial swelling, trouble swallowing and voice change.   Eyes: Negative for visual disturbance.  Respiratory: Negative for cough, chest tightness, shortness of breath and wheezing.   Cardiovascular: Negative for chest pain.  Gastrointestinal: Negative for abdominal pain, nausea and vomiting.  Musculoskeletal: Negative for myalgias, neck pain and neck stiffness.  Skin: Positive for rash. Negative for wound.       Red welts to bilateral upper and lower extremities, trunk.  No edema  Allergic/Immunologic: Negative for food allergies  and immunocompromised state.  Neurological: Negative for dizziness, weakness, numbness and headaches.  Psychiatric/Behavioral: Negative for confusion.    Physical Exam Updated Vital Signs BP 117/62   Pulse 93   Temp 98.4 F (36.9 C) (Oral)   Resp 20   LMP 08/25/2013   SpO2 93%   Physical Exam Vitals and nursing note reviewed.  Constitutional:      Appearance: Normal appearance. She is not ill-appearing or toxic-appearing.  HENT:     Mouth/Throat:     Mouth: Mucous membranes are moist.     Pharynx: Oropharynx is clear. Uvula midline. No pharyngeal swelling, posterior oropharyngeal erythema or uvula swelling.     Comments: Uvula is midline and non edematous.  No edema of the face, lips or tongue.   Eyes:     Conjunctiva/sclera: Conjunctivae normal.     Pupils: Pupils are equal, round, and reactive to light.  Cardiovascular:     Rate and Rhythm: Normal rate and regular rhythm.     Pulses: Normal pulses.  Pulmonary:     Effort: Pulmonary effort is normal. No respiratory distress.     Breath sounds: Normal breath sounds. No wheezing or rales.  Abdominal:     General: There is no distension.     Palpations: Abdomen is soft.     Tenderness: There is no abdominal tenderness.  Musculoskeletal:        General: No swelling. Normal range of motion.     Right lower leg: No edema.     Left lower leg: No edema.  Skin:    General: Skin is warm.     Capillary Refill: Capillary refill takes less than 2 seconds.     Findings: Rash present.     Comments: Erythematous, scattered raised plaques to the trunk, bilateral upper and lower extremities. Palmar surfaces of the hand and plantar surface of the feet are spared. No edema of the face.  Neurological:     General: No focal deficit present.     Mental Status: She is alert.     Sensory: No sensory deficit.     Motor: No weakness.     Comments: CN III-XII grossly intact.  Speech clear.      ED Results / Procedures / Treatments    Labs (all labs ordered are listed, but only abnormal results are displayed) Labs Reviewed - No data to display  EKG None  Radiology No results found.  Procedures Procedures (including critical care time)  Medications Ordered in ED Medications  diphenhydrAMINE (BENADRYL) injection 12.5 mg (has no administration in time range)  famotidine (PEPCID) IVPB 20 mg premix (has no administration in time range)  dexamethasone (DECADRON) injection 8 mg (has no administration in  time range)    ED Course  I have reviewed the triage vital signs and the nursing notes.  Pertinent labs & imaging results that were available during my care of the patient were reviewed by me and considered in my medical decision making (see chart for details).    MDM Rules/Calculators/A&P                          Patient here with diffuse urticaria, source unknown.  She does report recent exposure to fiberglass insulation.  No known new medications for known food allergies.  No history of anaphylaxis or angioedema.  Patient well-appearing, no acute distress.  She does have diffuse urticaria to most of her body including trunk and extremities.  Face, hands and feet appear to be spared.  On review of medical records, she was seen at urgent care earlier today got an IM injection of Depo-Medrol.  Has been taking Benadryl at home.  No concerning symptoms at present for anaphylaxis.  Will give IV Benadryl, H2 blocker and additional steroid and observe.    On recheck, pt reports feeling better.  She has tolerated oral fluid challenge and FB sensation of the throat has resolved.  Reason for reported syncopal episode is unclear, doubt clinically significant given that it occurred greater than 48 hours ago, and she has been without associated symptoms.  She has rx for prednisone at home, advised to start tomorrow, will continue benadryl.  Appears appropriate for d/c home.   Strict return precautions given  Final Clinical  Impression(s) / ED Diagnoses Final diagnoses:  Urticaria    Rx / DC Orders ED Discharge Orders    None       Kem Parkinson, PA-C 01/05/20 1736    Hayden Rasmussen, MD 01/06/20 1045

## 2020-01-05 NOTE — Discharge Instructions (Addendum)
As discussed, continue to take over-the-counter Benadryl 1 capsule (25 mg) every 6-8 hours for the next 2 to 3 days.  Start your prednisone prescription tomorrow evening.  Follow-up with your primary doctor for recheck, return to the emergency department if you develop any worsening symptoms such as shortness of breath, difficulty swallowing, swelling of your face lips or tongue.

## 2020-01-05 NOTE — ED Triage Notes (Signed)
Hives since 01-02-20. States she had a steroid injection today and hives are worse

## 2021-12-22 ENCOUNTER — Telehealth: Payer: 59 | Admitting: Family Medicine

## 2021-12-22 DIAGNOSIS — J111 Influenza due to unidentified influenza virus with other respiratory manifestations: Secondary | ICD-10-CM

## 2021-12-22 MED ORDER — OSELTAMIVIR PHOSPHATE 75 MG PO CAPS: 75.0000 mg | ORAL_CAPSULE | Freq: Two times a day (BID) | ORAL | 0 refills | Status: AC

## 2021-12-22 MED ORDER — PROMETHAZINE-DM 6.25-15 MG/5ML PO SYRP: 5.0000 mL | ORAL_SOLUTION | Freq: Four times a day (QID) | ORAL | 0 refills | Status: AC | PRN

## 2021-12-22 NOTE — Patient Instructions (Signed)

## 2021-12-22 NOTE — Progress Notes (Signed)
Virtual Visit Consent   Angela Wood, you are scheduled for a virtual visit with a [facility] provider today. Just as with appointments in the office, your consent must be obtained to participate. Your consent will be active for this visit and any virtual visit you may have with one of our providers in the next 365 days. If you have a MyChart account, a copy of this consent can be sent to you electronically.  As this is a virtual visit, video technology does not allow for your provider to perform a traditional examination. This may limit your provider's ability to fully assess your condition. If your provider identifies any concerns that need to be evaluated in person or the need to arrange testing (such as labs, EKG, etc.), we will make arrangements to do so. Although advances in technology are sophisticated, we cannot ensure that it will always work on either your end or our end. If the connection with a video visit is poor, the visit may have to be switched to a telephone visit. With either a video or telephone visit, we are not always able to ensure that we have a secure connection.  By engaging in this virtual visit, you consent to the provision of healthcare and authorize for your insurance to be billed (if applicable) for the services provided during this visit. Depending on your insurance coverage, you may receive a charge related to this service.  I need to obtain your verbal consent now. Are you willing to proceed with your visit today? Angela Wood has provided verbal consent on 12/22/2021 for a virtual visit (video or telephone). Dellia Nims, FNP  Date: 12/22/2021 5:18 PM  Virtual Visit via Video Note   I, Dellia Nims, connected with  Angela Wood  ([id number], 1965/09/17) on 12/22/21 at  5:15 PM EST by a video-enabled telemedicine application and verified that I am speaking with the correct person using two identifiers.  Location: Patient: Virtual Visit Location Patient:  Home Provider: Virtual Visit Location Provider: Home Office   I discussed the limitations of evaluation and management by telemedicine and the availability of in person appointments. The patient expressed understanding and agreed to proceed.    History of Present Illness: Angela Wood is a 56 y.o. who identifies as a female who was assigned female at birth, and is being seen today for influenza she caught from her husband. Sx started yesterday. Fever, body aches, chills, cough, headache. No wheezing or sob. Marland Kitchen  HPI: HPI  Problems: There are no problems to display for this patient.   Allergies:  Allergies  Allergen Reactions   Codeine Hives   Elemental Sulfur Hives   Medications:  Current Outpatient Medications:    oseltamivir (TAMIFLU) 75 MG capsule, Take 1 capsule (75 mg total) by mouth 2 (two) times daily for 5 days., Disp: 10 capsule, Rfl: 0   promethazine-dextromethorphan (PROMETHAZINE-DM) 6.25-15 MG/5ML syrup, Take 5 mLs by mouth 4 (four) times daily as needed for up to 10 days for cough., Disp: 118 mL, Rfl: 0   Coenzyme Q10 60 MG CAPS, Take 60 mg by mouth daily., Disp: , Rfl:    diphenhydrAMINE (BENADRYL) 25 mg capsule, Take 25 mg by mouth every 6 (six) hours as needed for allergies., Disp: , Rfl:    KRILL OIL PO, Take 1 tablet by mouth daily., Disp: , Rfl:    magnesium 30 MG tablet, Take 30 mg by mouth 2 (two) times daily., Disp: , Rfl:    potassium chloride (K-DUR,KLOR-CON) 10 MEQ  tablet, Take 10 mEq by mouth daily., Disp: , Rfl:   Observations/Objective: Patient is well-developed, well-nourished in no acute distress.  Resting comfortably  at home.  Head is normocephalic, atraumatic.  No labored breathing.  Speech is clear and coherent with logical content.  Patient is alert and oriented at baseline.    Assessment and Plan: 1. Influenza  Increase fluids, humidifier at night, tylenol or ibuprofen as directed, urgent care if sx persist or worsen.   Follow Up  Instructions: I discussed the assessment and treatment plan with the patient. The patient was provided an opportunity to ask questions and all were answered. The patient agreed with the plan and demonstrated an understanding of the instructions.  A copy of instructions were sent to the patient via MyChart unless otherwise noted below.     The patient was advised to call back or seek an in-person evaluation if the symptoms worsen or if the condition fails to improve as anticipated.  Time:  I spent 10 minutes with the patient via telehealth technology discussing the above problems/concerns.    Dellia Nims, FNP
